# Patient Record
Sex: Male | Born: 1950 | Race: White | Hispanic: No | Marital: Married | Smoking: Former smoker
Health system: Southern US, Community
[De-identification: ages and names within clinical notes are randomized; demographics above are authoritative.]

## PROBLEM LIST (undated history)

## (undated) DIAGNOSIS — Z8719 Personal history of other diseases of the digestive system: Secondary | ICD-10-CM

## (undated) DIAGNOSIS — K219 Gastro-esophageal reflux disease without esophagitis: Secondary | ICD-10-CM

## (undated) DIAGNOSIS — E785 Hyperlipidemia, unspecified: Secondary | ICD-10-CM

## (undated) DIAGNOSIS — Z8739 Personal history of other diseases of the musculoskeletal system and connective tissue: Secondary | ICD-10-CM

## (undated) DIAGNOSIS — IMO0001 Reserved for inherently not codable concepts without codable children: Secondary | ICD-10-CM

## (undated) DIAGNOSIS — G4730 Sleep apnea, unspecified: Secondary | ICD-10-CM

## (undated) DIAGNOSIS — M75100 Unspecified rotator cuff tear or rupture of unspecified shoulder, not specified as traumatic: Secondary | ICD-10-CM

## (undated) DIAGNOSIS — M2550 Pain in unspecified joint: Secondary | ICD-10-CM

## (undated) HISTORY — PX: HERNIA REPAIR: SHX51

## (undated) HISTORY — PX: TONSILLECTOMY: SUR1361

## (undated) HISTORY — DX: Hyperlipidemia, unspecified: E78.5

## (undated) HISTORY — PX: ESOPHAGOGASTRODUODENOSCOPY: SHX1529

## (undated) HISTORY — PX: COLONOSCOPY: SHX174

---

## 2010-03-17 HISTORY — PX: CATARACT EXTRACTION: SUR2

## 2011-04-17 ENCOUNTER — Ambulatory Visit (INDEPENDENT_AMBULATORY_CARE_PROVIDER_SITE_OTHER): Payer: Commercial Managed Care - PPO | Admitting: General Surgery

## 2011-04-17 ENCOUNTER — Encounter (INDEPENDENT_AMBULATORY_CARE_PROVIDER_SITE_OTHER): Payer: Self-pay | Admitting: General Surgery

## 2011-04-17 VITALS — BP 126/84 | HR 64 | Temp 98.6°F | Resp 18 | Ht 68.0 in | Wt 254.2 lb

## 2011-04-17 DIAGNOSIS — K429 Umbilical hernia without obstruction or gangrene: Secondary | ICD-10-CM

## 2011-04-17 NOTE — Progress Notes (Signed)
Patient ID: Harry Hall, male   DOB: 1951/04/18, 60 y.o.   MRN: [id number]  Chief Complaint  Patient presents with  . New Evaluation    eval of umbilical hernia     HPI Harry Hall is a 60 y.o. male.   HPI This patient is referred by Dr. Kathrynn Running for evaluation of an umbilical hernia. He states that he has had this for several years now but his wife is now pressuring him to have it fixed. He states it has increased in size over this time and a few months ago it bulged out" and twisted". He had a difficult time reducing the hernia at that time but other than that he states that the hernia is reducible. He denies any obstructive symptoms or changes in his bowels. He had a normal colonoscopy approximately one to 2 years ago. Past Medical History  Diagnosis Date  . Hyperlipidemia     Past Surgical History  Procedure Date  . Tonsillectomy   . Cataract extraction 03/17/2010    History reviewed. No pertinent family history.  Social History History  Substance Use Topics  . Smoking status: Former Games developer  . Smokeless tobacco: Never Used  . Alcohol Use: 8.4 oz/week    14 Shots of liquor per week    No Known Allergies  Current Outpatient Prescriptions  Medication Sig Dispense Refill  . aspirin 81 MG tablet Take 81 mg by mouth daily.        Marland Kitchen atorvastatin (LIPITOR) 80 MG tablet Take 80 mg by mouth daily.        . fish oil-omega-3 fatty acids 1000 MG capsule Take 2 g by mouth daily.          Review of Systems Review of Systems  All other systems reviewed and are negative.    Blood pressure 126/84, pulse 64, temperature 98.6 F (37 C), temperature source Temporal, resp. rate 18, height 5\' 8"  (1.727 m), weight 254 lb 4 oz (115.327 kg).  Physical Exam Physical Exam  Vitals reviewed. Constitutional: He is oriented to person, place, and time. He appears well-developed and well-nourished. No distress.  HENT:  Head: Normocephalic and atraumatic.  Mouth/Throat: No  oropharyngeal exudate.  Eyes: Conjunctivae and EOM are normal. Pupils are equal, round, and reactive to light. Right eye exhibits no discharge. Left eye exhibits no discharge. No scleral icterus.  Neck: Normal range of motion. No tracheal deviation present.  Cardiovascular: Normal rate, regular rhythm and normal heart sounds.   Pulmonary/Chest: Effort normal. No stridor. No respiratory distress. He has no wheezes.  Abdominal: Soft. Bowel sounds are normal. He exhibits no distension and no mass. There is no tenderness. There is no rebound and no guarding.       moderate sized umbilical hernia which is not completely reducible on exam today. No skin changes or signs of strangulation   Musculoskeletal: Normal range of motion. He exhibits no edema and no tenderness.  Neurological: He is alert and oriented to person, place, and time.  Skin: Skin is warm and dry. No rash noted. He is not diaphoretic. No erythema. No pallor.  Psychiatric: He has a normal mood and affect. His behavior is normal. Judgment and thought content normal.    Data Reviewed   Assessment    Umbilical hernia He has a moderate-sized hernia and has had an episode of what he describes as incarceration however it is a partially reducible on exam today. He is relatively asymptomatic however it is increasing in size  and he would like it repaired. We discussed both  options of open and laparoscopic repairs. We discussed the risks and benefits of each repair. I think that given his size and the size of this hernia I think that a laparoscopic repair would probably be best for him. He also has a large diastasis and I explained that this would not be repaired with the surgery. I explained the risks of infection, bleeding, pain, scarring, recurrence, seroma, and persistent bulge, chronic pain. He expressed understanding and desires to proceed with laparoscopic umbilical hernia repair with mesh.    Plan    We will plan for laparoscopic  repair as soon as available. I would recommend at least overnight observation for pain control as well.       Lodema Pilot DAVID 04/17/2011, 3:38 PM

## 2011-09-19 DIAGNOSIS — E785 Hyperlipidemia, unspecified: Secondary | ICD-10-CM | POA: Insufficient documentation

## 2012-09-21 DIAGNOSIS — Q792 Exomphalos: Secondary | ICD-10-CM | POA: Insufficient documentation

## 2013-03-24 ENCOUNTER — Telehealth (INDEPENDENT_AMBULATORY_CARE_PROVIDER_SITE_OTHER): Payer: Self-pay

## 2013-03-24 NOTE — Telephone Encounter (Signed)
Called and left messae for patient to call our office regarding his appointment tomorrow.  Please see if patient can come in at 10:45am.

## 2013-03-25 ENCOUNTER — Ambulatory Visit (INDEPENDENT_AMBULATORY_CARE_PROVIDER_SITE_OTHER): Payer: BC Managed Care – PPO | Admitting: General Surgery

## 2013-03-25 ENCOUNTER — Encounter (INDEPENDENT_AMBULATORY_CARE_PROVIDER_SITE_OTHER): Payer: Self-pay | Admitting: General Surgery

## 2013-03-25 VITALS — BP 146/76 | HR 76 | Temp 97.4°F | Resp 16 | Ht 67.0 in | Wt 251.4 lb

## 2013-03-25 DIAGNOSIS — K429 Umbilical hernia without obstruction or gangrene: Secondary | ICD-10-CM

## 2013-03-25 NOTE — Progress Notes (Signed)
Patient ID: Harry Hall, male   DOB: 19-Dec-1950, 62 y.o.   MRN: [id number]  Chief Complaint  Patient presents with  . Umbilical Hernia    LTF    HPI Harry Hall is a 61 y.o. male.  The patient is a 62 year old male who earlier for secondary to an umbilical hernia. Patient states she's had this there for several years. He states that maybe a 1 point twisted. The patient states that he feels that the hernia has gotten bigger over time. He states he has minimal pain.  HPI  Past Medical History  Diagnosis Date  . Hyperlipidemia     Past Surgical History  Procedure Laterality Date  . Tonsillectomy    . Cataract extraction  03/17/2010    Family History  Problem Relation Age of Onset  . Cancer Father     pancreatic    Social History History  Substance Use Topics  . Smoking status: Former Games developer  . Smokeless tobacco: Never Used  . Alcohol Use: 8.4 oz/week    14 Shots of liquor per week    No Known Allergies  Current Outpatient Prescriptions  Medication Sig Dispense Refill  . aspirin 81 MG tablet Take 81 mg by mouth daily.        Marland Kitchen atorvastatin (LIPITOR) 80 MG tablet Take 80 mg by mouth daily.        . fish oil-omega-3 fatty acids 1000 MG capsule Take 2 g by mouth daily.         No current facility-administered medications for this visit.    Review of Systems Review of Systems  Constitutional: Negative.   HENT: Negative.   Respiratory: Negative.   Cardiovascular: Negative.   Gastrointestinal: Negative.   Neurological: Negative.   All other systems reviewed and are negative.    Blood pressure 146/76, pulse 76, temperature 97.4 F (36.3 C), temperature source Temporal, resp. rate 16, height 5\' 7"  (1.702 m), weight 251 lb 6.4 oz (114.034 kg).  Physical Exam Physical Exam  Constitutional: He is oriented to person, place, and time. He appears well-developed and well-nourished.  HENT:  Head: Normocephalic and atraumatic.  Eyes: Conjunctivae and EOM are  normal. Pupils are equal, round, and reactive to light.  Neck: Normal range of motion. Neck supple.  Cardiovascular: Normal rate, regular rhythm and normal heart sounds.   Pulmonary/Chest: Effort normal and breath sounds normal.  Abdominal: Soft. Bowel sounds are normal. A hernia is present. Hernia confirmed positive in the ventral area.    Musculoskeletal: Normal range of motion.  Neurological: He is alert and oriented to person, place, and time.  Skin: Skin is warm and dry.    Data Reviewed none  Assessment    62 year old male with an incarcerated umbilical hernia, and rectus diastases     Plan    1. We'll proceed to the operating room for a laparoscopic umbilical hernia repair with mesh. 2.All risks and benefits were discussed with the patient, to generally include infection, bleeding, damage to surrounding structures, acute and chronic nerve pain, and recurrence. Alternatives were offered and described.  All questions were answered and the patient voiced understanding of the procedure and wishes to proceed at this point.         Marigene Ehlers., Shaquoia Miers 03/25/2013, 11:02 AM

## 2013-03-29 ENCOUNTER — Telehealth (INDEPENDENT_AMBULATORY_CARE_PROVIDER_SITE_OTHER): Payer: Self-pay | Admitting: General Surgery

## 2013-03-29 NOTE — Telephone Encounter (Signed)
Patient met with surgery scheduling given financial responsibilities, will call back to schedule, face sheet placed in pending °

## 2013-10-07 ENCOUNTER — Emergency Department (HOSPITAL_COMMUNITY): Payer: Worker's Compensation

## 2013-10-07 ENCOUNTER — Observation Stay (HOSPITAL_COMMUNITY)
Admission: EM | Admit: 2013-10-07 | Discharge: 2013-10-12 | Disposition: A | Discharge status: Skilled Nursing Facility | Payer: Worker's Compensation | Attending: Orthopedic Surgery | Admitting: Orthopedic Surgery

## 2013-10-07 ENCOUNTER — Encounter (HOSPITAL_COMMUNITY): Payer: Self-pay | Admitting: Emergency Medicine

## 2013-10-07 DIAGNOSIS — Z7982 Long term (current) use of aspirin: Secondary | ICD-10-CM | POA: Insufficient documentation

## 2013-10-07 DIAGNOSIS — E785 Hyperlipidemia, unspecified: Secondary | ICD-10-CM | POA: Insufficient documentation

## 2013-10-07 DIAGNOSIS — M75100 Unspecified rotator cuff tear or rupture of unspecified shoulder, not specified as traumatic: Secondary | ICD-10-CM

## 2013-10-07 DIAGNOSIS — S92002A Unspecified fracture of left calcaneus, initial encounter for closed fracture: Secondary | ICD-10-CM | POA: Diagnosis present

## 2013-10-07 DIAGNOSIS — S92009A Unspecified fracture of unspecified calcaneus, initial encounter for closed fracture: Principal | ICD-10-CM | POA: Insufficient documentation

## 2013-10-07 DIAGNOSIS — Z87891 Personal history of nicotine dependence: Secondary | ICD-10-CM | POA: Insufficient documentation

## 2013-10-07 DIAGNOSIS — S43004A Unspecified dislocation of right shoulder joint, initial encounter: Secondary | ICD-10-CM

## 2013-10-07 DIAGNOSIS — W11XXXA Fall on and from ladder, initial encounter: Secondary | ICD-10-CM | POA: Insufficient documentation

## 2013-10-07 DIAGNOSIS — S43016A Anterior dislocation of unspecified humerus, initial encounter: Secondary | ICD-10-CM | POA: Insufficient documentation

## 2013-10-07 DIAGNOSIS — Y990 Civilian activity done for income or pay: Secondary | ICD-10-CM | POA: Insufficient documentation

## 2013-10-07 HISTORY — DX: Unspecified rotator cuff tear or rupture of unspecified shoulder, not specified as traumatic: M75.100

## 2013-10-07 LAB — COMPREHENSIVE METABOLIC PANEL
ALK PHOS: 57 U/L (ref 39–117)
ALT: 26 U/L (ref 0–53)
AST: 18 U/L (ref 0–37)
Albumin: 4.1 g/dL (ref 3.5–5.2)
BILIRUBIN TOTAL: 0.5 mg/dL (ref 0.3–1.2)
BUN: 13 mg/dL (ref 6–23)
CHLORIDE: 104 meq/L (ref 96–112)
CO2: 25 meq/L (ref 19–32)
Calcium: 9.7 mg/dL (ref 8.4–10.5)
Creatinine, Ser: 0.84 mg/dL (ref 0.50–1.35)
GFR calc non Af Amer: >90 mL/min (ref >90)
GLUCOSE: 117 mg/dL — AB (ref 70–99)
Potassium: 3.9 mEq/L (ref 3.7–5.3)
SODIUM: 143 meq/L (ref 137–147)
Total Protein: 6.9 g/dL (ref 6.0–8.3)

## 2013-10-07 LAB — CBC WITH DIFFERENTIAL/PLATELET
Basophils Absolute: 0 10*3/uL (ref 0.0–0.1)
Basophils Relative: 0 % (ref 0–1)
EOS PCT: 1 % (ref 0–5)
Eosinophils Absolute: 0.1 10*3/uL (ref 0.0–0.7)
HCT: 44.2 % (ref 39.0–52.0)
Hemoglobin: 14.7 g/dL (ref 13.0–17.0)
LYMPHS ABS: 1.8 10*3/uL (ref 0.7–4.0)
LYMPHS PCT: 20 % (ref 12–46)
MCH: 31.3 pg (ref 26.0–34.0)
MCHC: 33.3 g/dL (ref 30.0–36.0)
MCV: 94.2 fL (ref 78.0–100.0)
Monocytes Absolute: 0.6 10*3/uL (ref 0.1–1.0)
Monocytes Relative: 6 % (ref 3–12)
NEUTROS ABS: 6.6 10*3/uL (ref 1.7–7.7)
Neutrophils Relative %: 73 % (ref 43–77)
PLATELETS: 265 10*3/uL (ref 150–400)
RBC: 4.69 MIL/uL (ref 4.22–5.81)
RDW: 13.6 % (ref 11.5–15.5)
WBC: 9 10*3/uL (ref 4.0–10.5)

## 2013-10-07 MED ORDER — HYDROMORPHONE HCL PF 1 MG/ML IJ SOLN
1.0000 mg | Freq: Once | INTRAMUSCULAR | Status: AC
Start: 2013-10-07 — End: 2013-10-07
  Administered 2013-10-07: 1 mg via INTRAVENOUS
  Filled 2013-10-07: qty 1

## 2013-10-07 MED ORDER — HYDROMORPHONE HCL PF 1 MG/ML IJ SOLN
INTRAMUSCULAR | Status: AC
  Filled 2013-10-07: qty 1

## 2013-10-07 MED ORDER — HYDROMORPHONE HCL PF 1 MG/ML IJ SOLN
1.0000 mg | Freq: Once | INTRAMUSCULAR | Status: AC
Start: 2013-10-07 — End: 2013-10-07
  Administered 2013-10-07: 1 mg via INTRAVENOUS

## 2013-10-07 MED ORDER — IOHEXOL 300 MG/ML  SOLN
100.0000 mL | Freq: Once | INTRAMUSCULAR | Status: AC | PRN
  Administered 2013-10-07: 100 mL via INTRAVENOUS

## 2013-10-07 NOTE — ED Notes (Signed)
Right shoulder relocated by Dr Darl Householder without any problems.  Pt tolerated well.

## 2013-10-07 NOTE — ED Notes (Signed)
Returned from CT and x-ray.  Pt remains alert and oriented x's 3.  Continues to c/o pain.

## 2013-10-07 NOTE — ED Notes (Signed)
Family at bedside. 

## 2013-10-07 NOTE — ED Notes (Signed)
Pt to x-ray and CT at this time.

## 2013-10-07 NOTE — ED Notes (Signed)
Pt to ED via [facility] after reported falling approx 15-20 ft off a ladder.  Pt landed in a bush and denies LOC.  Pt c/o pain in right shoulder and left ankle.  EMS gave pt Fentanyl 159mcg IV PTA.

## 2013-10-07 NOTE — ED Notes (Signed)
Pt remains in radiology. Introduced self to family in room.

## 2013-10-07 NOTE — ED Notes (Signed)
Pt st's no change in pain after pain med.  Pt remains alert and oriented x's 3.

## 2013-10-07 NOTE — ED Provider Notes (Signed)
CSN: [id number]     Arrival date & time 10/07/13  1933 History   First MD Initiated Contact with Patient 10/07/13 1947     Chief Complaint  Patient presents with  . Fall  . Shoulder Injury  . Ankle Pain     (Consider location/radiation/quality/duration/timing/severity/associated sxs/prior Treatment) The history is provided by the patient.  Harry Hall is a 63 y.o. male hx of HL here with fall. Patient was on a 20 foot ladder and the ladder broke and he fell off and landed in a bush. Had possible head injury but no LOC. Complained of back and R shoulder pain and left ankle pain afterwards. Given fentanyl 150 mcg by EMS.    Past Medical History  Diagnosis Date  . Hyperlipidemia    Past Surgical History  Procedure Laterality Date  . Tonsillectomy    . Cataract extraction  03/17/2010   Family History  Problem Relation Age of Onset  . Cancer Father     pancreatic   History  Substance Use Topics  . Smoking status: Former Research scientist (life sciences)  . Smokeless tobacco: Never Used  . Alcohol Use: 8.4 oz/week    14 Shots of liquor per week    Review of Systems  Musculoskeletal: Positive for back pain.       L ankle pain, R shoulder pain   All other systems reviewed and are negative.     Allergies  Review of patient's allergies indicates no known allergies.  Home Medications   Prior to Admission medications   Medication Sig Start Date End Date Taking? Authorizing Provider  aspirin 81 MG tablet Take 81 mg by mouth daily.     Yes Historical Provider, MD  atorvastatin (LIPITOR) 80 MG tablet Take 80 mg by mouth daily.     Yes Historical Provider, MD  cetirizine (ZYRTEC) 10 MG tablet Take 10 mg by mouth daily.   Yes Historical Provider, MD  fish oil-omega-3 fatty acids 1000 MG capsule Take 1 g by mouth daily.    Yes Historical Provider, MD  ibuprofen (ADVIL,MOTRIN) 200 MG tablet Take 600 mg by mouth every 6 (six) hours as needed for headache.   Yes Historical Provider, MD   BP 126/65   Pulse 66  Temp(Src) 98.5 F (36.9 C) (Oral)  Resp 25  Ht 5\' 8"  (1.727 m)  Wt 245 lb (111.131 kg)  BMI 37.26 kg/m2  SpO2 94% Physical Exam  Nursing note and vitals reviewed. Constitutional: He is oriented to person, place, and time.  Uncomfortable  HENT:  Head: Normocephalic.  Mouth/Throat: Oropharynx is clear and moist.  Eyes: Conjunctivae are normal. Pupils are equal, round, and reactive to light.  Neck:   c collar in place   Cardiovascular: Normal rate, regular rhythm and normal heart sounds.   Pulmonary/Chest: Effort normal and breath sounds normal. No respiratory distress. He has no wheezes. He has no rales.  Abdominal: Soft. Bowel sounds are normal. He exhibits no distension. There is no tenderness. There is no rebound.  Musculoskeletal:  R shoulder anteriorly dislocated. L ankle swollen and tender. Nl ROM bilateral hips.   Neurological: He is alert and oriented to person, place, and time. No cranial nerve deficit. Coordination normal.  Skin: Skin is warm and dry.  Psychiatric: He has a normal mood and affect. His behavior is normal. Judgment and thought content normal.    ED Course  Reduction of dislocation Date/Time: 10/07/2013 11:32 PM Performed by: Wandra Arthurs Authorized by: Wandra Arthurs Consent: Verbal  consent obtained. Risks and benefits: risks, benefits and alternatives were discussed Consent given by: patient Patient understanding: patient states understanding of the procedure being performed Patient consent: the patient's understanding of the procedure matches consent given Procedure consent: procedure consent matches procedure scheduled Relevant documents: relevant documents present and verified Required items: required blood products, implants, devices, and special equipment available Patient identity confirmed: arm band and verbally with patient Time out: Immediately prior to procedure a "time out" was called to verify the correct patient, procedure,  equipment, support staff and site/side marked as required. Preparation: Patient was prepped and draped in the usual sterile fashion. Local anesthesia used: yes Anesthesia: nerve block Local anesthetic: lidocaine 2% without epinephrine Anesthetic total: 10 ml Patient sedated: no Patient tolerance: Patient tolerated the procedure well with no immediate complications.   (including critical care time)    Labs Review Labs Reviewed  COMPREHENSIVE METABOLIC PANEL - Abnormal; Notable for the following:    Glucose, Bld 117 (*)    All other components within normal limits  CBC WITH DIFFERENTIAL    Imaging Review Dg Chest 2 View  10/07/2013   CLINICAL DATA:  25 foot fall  EXAM: CHEST  2 VIEW  COMPARISON:  Concurrently obtained radiographs of the bilateral shoulders  FINDINGS: Incompletely imaged right inferior shoulder dislocation. Inspiratory volumes are low and there may be mild bibasilar atelectasis. No pneumothorax or large pleural effusion. No displaced rib fracture. The vertebral body heights are maintained on the lateral view. Mild multilevel degenerative spurring.  IMPRESSION: 1. Low inspiratory volumes and bibasilar subsegmental atelectasis. 2. Otherwise, no acute cardiopulmonary process. 3. Incompletely imaged right anteroinferior shoulder dislocation.   Electronically Signed   By: Jacqulynn Cadet M.D.   On: 10/07/2013 21:48   Dg Shoulder Right  10/07/2013   CLINICAL DATA:  [place] 25 foot from a ladder that broke, RIGHT shoulder pain  EXAM: RIGHT SHOULDER - 2+ VIEW  COMPARISON:  None  FINDINGS: Osseous mineralization normal for technique.  AC joint alignment normal.  Question os acromiale.  Anterior glenohumeral dislocation.  No definite fracture.  Visualized RIGHT ribs intact.  IMPRESSION: Anterior RIGHT glenohumeral dislocation.   Electronically Signed   By: Lavonia Dana M.D.   On: 10/07/2013 21:49   Dg Ankle Complete Left  10/07/2013   CLINICAL DATA:  25 foot fall  EXAM: LEFT ANKLE  COMPLETE - 3+ VIEW  COMPARISON:  None.  FINDINGS: Comminuted fracture through the mid aspect of the calcaneus. There is associated soft tissue swelling and infiltration of Kager's fat pad. Fracture line extends into the articular surface of the subtalar space. Focal lucency in the medial aspect of the talar dome concerning for acute osteochondral defect. The distal fibula and tibia appear intact.  IMPRESSION: 1. Comminuted and impacted fracture through the mid calcaneus. 2. Suspect acute osteochondral defect of the medial aspect of the talar dome. 3. Extensive soft tissue swelling about the ankle.   Electronically Signed   By: Jacqulynn Cadet M.D.   On: 10/07/2013 21:50   Ct Head Wo Contrast  10/07/2013   CLINICAL DATA:  Fell 15-20 feet off a ladder landing in bushes, RIGHT shoulder pain/injury  EXAM: CT HEAD WITHOUT CONTRAST  CT CERVICAL SPINE WITHOUT CONTRAST  TECHNIQUE: Multidetector CT imaging of the head and cervical spine was performed following the standard protocol without intravenous contrast. Multiplanar CT image reconstructions of the cervical spine were also generated.  COMPARISON:  None  FINDINGS: CT HEAD FINDINGS  Minimal age-related atrophy.  Normal ventricular  morphology.  No midline shift or mass effect.  Otherwise normal appearance of brain parenchyma.  No intracranial hemorrhage, mass lesion or evidence acute infarction.  No extra-axial fluid collections.  Bones is sinuses unremarkable.  CT CERVICAL SPINE FINDINGS  Prevertebral soft tissues normal thickness.  Disc space narrowing and endplate spur formation C6-C7, C4-C5.  Vertebral body heights maintained without fracture or subluxation.  Scattered facet degenerative changes greater in upper to mid cervical spine.  Visualized skullbase intact.  Atherosclerotic calcification in the LEFT carotid system.  Lung apices clear.  IMPRESSION: No acute intracranial abnormalities.  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   No acute cervical spine abnormalities.   Electronically Signed   By: Lavonia Dana M.D.   On: 10/07/2013 22:23   Ct Cervical Spine Wo Contrast  10/07/2013   CLINICAL DATA:  [place] 15-20 feet off a ladder landing in bushes, RIGHT shoulder pain/injury  EXAM: CT HEAD WITHOUT CONTRAST  CT CERVICAL SPINE WITHOUT CONTRAST  TECHNIQUE: Multidetector CT imaging of the head and cervical spine was performed following the standard protocol without intravenous contrast. Multiplanar CT image reconstructions of the cervical spine were also generated.  COMPARISON:  None  FINDINGS: CT HEAD FINDINGS  Minimal age-related atrophy.  Normal ventricular morphology.  No midline shift or mass effect.  Otherwise normal appearance of brain parenchyma.  No intracranial hemorrhage, mass lesion or evidence acute infarction.  No extra-axial fluid collections.  Bones is sinuses unremarkable.  CT CERVICAL SPINE FINDINGS  Prevertebral soft tissues normal thickness.  Disc space narrowing and endplate spur formation C6-C7, C4-C5.  Vertebral body heights maintained without fracture or subluxation.  Scattered facet degenerative changes greater in upper to mid cervical spine.  Visualized skullbase intact.  Atherosclerotic calcification in the LEFT carotid system.  Lung apices clear.  IMPRESSION: No acute intracranial abnormalities.  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  No acute cervical spine abnormalities.   Electronically Signed   By: Lavonia Dana M.D.   On: 10/07/2013 22:23     EKG Interpretation None      MDM   Final diagnoses:  None   Harry Hall is a 63 y.o. male here with fall. Will do trauma workup and get xrays.   11:32 PM Xray showed R shoulder dislocation and L ankle fracture. I reduced R shoulder dislocation. I called Dr. Marlou Sa, who recommend CT for L ankle and back xrays. Will get post reduction xray, back xrays and CT ab/pel. CT head/neck unremarkable. Signed out to Dr. Sharol Given to f/u  xrays and CT and ortho consult.     Wandra Arthurs, MD 10/07/13 [phone]

## 2013-10-08 ENCOUNTER — Other Ambulatory Visit (HOSPITAL_COMMUNITY): Payer: Self-pay | Admitting: Orthopedic Surgery

## 2013-10-08 ENCOUNTER — Observation Stay (HOSPITAL_COMMUNITY): Payer: Worker's Compensation

## 2013-10-08 ENCOUNTER — Encounter (HOSPITAL_COMMUNITY): Payer: Self-pay | Admitting: *Deleted

## 2013-10-08 DIAGNOSIS — S92002A Unspecified fracture of left calcaneus, initial encounter for closed fracture: Secondary | ICD-10-CM | POA: Diagnosis present

## 2013-10-08 LAB — SURGICAL PCR SCREEN
MRSA, PCR: NEGATIVE
Staphylococcus aureus: NEGATIVE

## 2013-10-08 MED ORDER — ATORVASTATIN CALCIUM 80 MG PO TABS
80.0000 mg | ORAL_TABLET | Freq: Every day | ORAL | Status: DC
  Administered 2013-10-08 – 2013-10-12 (×4): 80 mg via ORAL
  Filled 2013-10-08 (×5): qty 1

## 2013-10-08 MED ORDER — CHLORHEXIDINE GLUCONATE 4 % EX LIQD
60.0000 mL | Freq: Once | CUTANEOUS | Status: AC
  Administered 2013-10-08: 4 via TOPICAL
  Filled 2013-10-08: qty 60

## 2013-10-08 MED ORDER — ASPIRIN EC 81 MG PO TBEC
81.0000 mg | DELAYED_RELEASE_TABLET | Freq: Every day | ORAL | Status: DC
  Administered 2013-10-08: 81 mg via ORAL
  Filled 2013-10-08 (×2): qty 1

## 2013-10-08 MED ORDER — CHLORHEXIDINE GLUCONATE 4 % EX LIQD
60.0000 mL | Freq: Once | CUTANEOUS | Status: AC
  Administered 2013-10-09: 4 via TOPICAL
  Filled 2013-10-08: qty 60

## 2013-10-08 MED ORDER — ENOXAPARIN SODIUM 30 MG/0.3ML ~~LOC~~ SOLN
30.0000 mg | Freq: Two times a day (BID) | SUBCUTANEOUS | Status: DC
  Administered 2013-10-09: 30 mg via SUBCUTANEOUS
  Filled 2013-10-08 (×2): qty 0.3

## 2013-10-08 MED ORDER — CEFAZOLIN SODIUM-DEXTROSE 2-3 GM-% IV SOLR
2.0000 g | INTRAVENOUS | Status: AC
  Administered 2013-10-09: 3 g via INTRAVENOUS
  Filled 2013-10-08: qty 50

## 2013-10-08 MED ORDER — HEPARIN SODIUM (PORCINE) 5000 UNIT/ML IJ SOLN
5000.0000 [IU] | Freq: Three times a day (TID) | INTRAMUSCULAR | Status: DC
  Administered 2013-10-08 (×3): 5000 [IU] via SUBCUTANEOUS
  Filled 2013-10-08 (×6): qty 1

## 2013-10-08 MED ORDER — OXYCODONE-ACETAMINOPHEN 5-325 MG PO TABS
2.0000 | ORAL_TABLET | ORAL | Status: DC | PRN
  Administered 2013-10-08 (×4): 2 via ORAL
  Filled 2013-10-08 (×4): qty 2

## 2013-10-08 MED ORDER — LORATADINE 10 MG PO TABS
10.0000 mg | ORAL_TABLET | Freq: Every day | ORAL | Status: DC
  Administered 2013-10-08 – 2013-10-12 (×4): 10 mg via ORAL
  Filled 2013-10-08 (×5): qty 1

## 2013-10-08 MED ORDER — HYDROMORPHONE HCL PF 1 MG/ML IJ SOLN
1.0000 mg | Freq: Once | INTRAMUSCULAR | Status: AC
  Administered 2013-10-08: 1 mg via INTRAVENOUS
  Filled 2013-10-08: qty 1

## 2013-10-08 NOTE — Evaluation (Signed)
Physical Therapy Evaluation Patient Details Name: Harry Hall MRN: [id number] DOB: 02-Mar-1951 Today's Date: 10/08/2013   History of Present Illness  Pt. was admitted 10/07/13 following a 20 foot fall from a ladder after ladder broke.  Pt. sustained  a R shoulder dislocation, now in sling and a Left calcaneus fx.  Dr. Sharol Given to determine time of surgical repair of heel fx. Shoulder dislocation reduced in the ED.  P.T. ordered for NWB L LE  Clinical Impression  Pt. Presents to PT with deficits in functional mobility and gait due to his injuries.  He reports that his left foot/ankle feels different than it had earlier.  The RN is aware and is contacting ortho tech to check his splint.  Toes feel warm, he can wiggle toes and has good sensation.  In terms of his mobility, he is currently limited to transfers. Once results of MRI are known, if he is allowed to WB through R UE, we may need to try platform RW.  Pt's left heel to be evaluated by Dr. Sharol Given to determine timing for surgical fixation.  Will follow for transfers in the meantime.    Follow Up Recommendations Other (comment) (TBD postoperatively, may need HHPT)    Equipment Recommendations  Other (comment) (TBD post operatively/ post MRI)    Recommendations for Other Services       Precautions / Restrictions Precautions Precautions: Fall Required Braces or Orthoses: Sling Restrictions Weight Bearing Restrictions: Yes LLE Weight Bearing: Non weight bearing Other Position/Activity Restrictions: Pt.'s R UE in sling and presumed NWB until results of MRI known.        Mobility  Bed Mobility Overal bed mobility: Modified Independent             General bed mobility comments: pt. came to EOB independently with slight increased time needed due to injuries  Transfers Overall transfer level: Needs assistance Equipment used: None Transfers: Sit to/from Stand;Stand Pivot Transfers   Stand pivot transfers: +2 physical  assistance;Min assist       General transfer comment: Pt. needed min assist to rise to stand and 2 min assist for pivot transfer to recliner for balance and stability.    Ambulation/Gait Ambulation/Gait assistance:  (not able)              Stairs            Wheelchair Mobility    Modified Rankin (Stroke Patients Only)       Balance Overall balance assessment: Needs assistance Sitting-balance support: Feet supported Sitting balance-Leahy Scale: Good     Standing balance support: Single extremity supported;During functional activity Standing balance-Leahy Scale: Fair Standing balance comment: support needed to maintain standing balance briefly during transfer                             Pertinent Vitals/Pain See vitals tab Painful in left foot/ankle , says he had pain med at 12:30pm    [place] expects to be discharged to:: Private residence Living Arrangements: Spouse/significant other Available Help at Discharge: Family;Available 24 hours/day Type of Home: House Home Access: Level entry     Home Layout: One level Home Equipment: None      Prior Function Level of Independence: Independent         Comments: Pt. reports he works and is fully independent     Hand Dominance   Dominant Hand: Right    Extremity/Trunk Assessment   Upper  Extremity Assessment: Defer to OT evaluation           Lower Extremity Assessment: LLE deficits/detail   LLE Deficits / Details: Pt. has splint intact Left lower leg; toes are warm and dry, he has sensation to light touch and he can wiggle toes.  He reports his left foot/heel feels different than it did earlier .  He has made his RN aware of this and RN is about to phone ortho tech to check his splint.    Cervical / Trunk Assessment: Normal  Communication   Communication: No difficulties  Cognition Arousal/Alertness: Awake/alert Behavior During Therapy: WFL for tasks  assessed/performed Overall Cognitive Status: Within Functional Limits for tasks assessed                      General Comments      Exercises        Assessment/Plan    PT Assessment Patient needs continued PT services  PT Diagnosis Difficulty walking;Acute pain;Abnormality of gait   PT Problem List Decreased activity tolerance;Decreased balance;Decreased mobility;Decreased knowledge of use of DME;Decreased knowledge of precautions;Pain  PT Treatment Interventions DME instruction;Gait training;Functional mobility training;Therapeutic activities;Balance training;Patient/family education;Therapeutic exercise   PT Goals (Current goals can be found in the Care Plan section) Acute Rehab PT Goals Patient Stated Goal: home for recovery PT Goal Formulation: With patient Time For Goal Achievement: 10/15/13 Potential to Achieve Goals: Good    Frequency Min 5X/week   Barriers to discharge        Co-evaluation PT/OT/SLP Co-Evaluation/Treatment: Yes Reason for Co-Treatment: For patient/therapist safety PT goals addressed during session: Mobility/safety with mobility;Balance;Other (comment) (precautions)         End of Session Equipment Utilized During Treatment: Gait belt Activity Tolerance: Patient tolerated treatment well Patient left: in chair;with call bell/phone within reach;with family/visitor present Nurse Communication: Mobility status;Precautions;Weight bearing status (informed nursing tech of safest technique for back to bed)    Functional Assessment Tool Used: clinical observation Functional Limitation: Mobility: Walking and moving around Mobility: Walking and Moving Around Current Status ([id number]): At least 40 percent but less than 60 percent impaired, limited or restricted Mobility: Walking and Moving Around Goal Status [phone]): At least 20 percent but less than 40 percent impaired, limited or restricted Mobility: Walking and Moving Around Discharge Status  [phone]): At least 20 percent but less than 40 percent impaired, limited or restricted    Time: 3212-2482 PT Time Calculation (min): 24 min   Charges:   PT Evaluation $Initial PT Evaluation Tier I: 1 Procedure PT Treatments $Therapeutic Activity: 8-22 mins   PT G Codes:   Functional Assessment Tool Used: clinical observation Functional Limitation: Mobility: Walking and moving around    Ladona Ridgel 10/08/2013, 2:15 PM Gerlean Ren PT Acute Rehab Services [phone] [place] [phone]

## 2013-10-08 NOTE — Evaluation (Signed)
Occupational Therapy Evaluation Patient Details Name: Harry Hall MRN: [id number] DOB: 1951-02-22 Today's Date: 10/08/2013    History of Present Illness Pt. was admitted 10/07/13 following a 20 foot fall from a ladder after ladder broke.  Pt. sustained  a R shoulder dislocation, now in sling and a Left calcaneus fx.  Dr. Sharol Given to determine time of surgical repair of heel fx. Shoulder dislocation reduced in the ED.     Clinical Impression   Pt admitted with above. OT order states R UE pendulums after MRI.  Pt still awaiting MRI of right shoulder at time of eval.   Will need clarification regarding weight bearing status of R UE. Pt's left heel to be evaluated by Dr. Sharol Given to determine timing for surgical fixation.  Pt is limited to stand pivot transfers at this time.  Will continue to follow acutely in order to address below problem list.  Follow Up Recommendations   (TBD post-operatively, possibly HHOT)    Equipment Recommendations  3 in 1 bedside comode    Recommendations for Other Services       Precautions / Restrictions Precautions Precautions: Fall Required Braces or Orthoses: Sling Restrictions Weight Bearing Restrictions: Yes LLE Weight Bearing: Non weight bearing Other Position/Activity Restrictions: Pt.'s R UE in sling and presumed NWB until results of MRI known.        Mobility Bed Mobility Overal bed mobility: Modified Independent             General bed mobility comments: pt. came to EOB independently with slight increased time needed due to injuries  Transfers Overall transfer level: Needs assistance Equipment used: None Transfers: Sit to/from Stand;Stand Pivot Transfers Sit to Stand: +2 physical assistance;Min assist Stand pivot transfers: +2 physical assistance;Min assist       General transfer comment: Pt. needed min assist to rise to stand and 2 min assist for pivot transfer to recliner for balance and stability.      Balance Overall balance  assessment: Needs assistance Sitting-balance support: Feet supported Sitting balance-Leahy Scale: Good     Standing balance support: Single extremity supported;During functional activity Standing balance-Leahy Scale: Fair Standing balance comment: support needed to maintain standing balance briefly during transfer                            ADL Overall ADL's : Needs assistance/impaired Eating/Feeding: Set up;Sitting       Upper Body Bathing: Moderate assistance;Sitting   Lower Body Bathing: Minimal assistance;Sitting/lateral leans   Upper Body Dressing : Moderate assistance;Sitting Upper Body Dressing Details (indicate cue type and reason): assist with sling Lower Body Dressing: Moderate assistance;Sitting/lateral leans Lower Body Dressing Details (indicate cue type and reason):  Able to doff right sock with left hand.  Unable to don right sock. Toilet Transfer: +2 for physical assistance;Minimal assistance;Stand-pivot           Functional mobility during ADLs: +2 for physical assistance;Minimal assistance General ADL Comments: Pt requiring incr time for all tasks due to left LE pain.     Vision                     Perception     Praxis      Pertinent Vitals/Pain C/o 6/10 pain in LLE.      Hand Dominance Right   Extremity/Trunk Assessment Upper Extremity Assessment Upper Extremity Assessment: RUE deficits/detail RUE Deficits / Details: Wrist and hand AROM WFL.   RUE:  Unable to fully assess due to immobilization (sling)   Lower Extremity Assessment Lower Extremity Assessment: LLE deficits/detail LLE Deficits / Details: Pt. has splint intact Left lower leg; toes are warm and dry, he has sensation to light touch and he can wiggle toes.  He reports his left foot/heel feels different than it did earlier .  He has made his RN aware of this and RN is about to phone ortho tech to check his splint.   LLE: Unable to fully assess due to immobilization    Cervical / Trunk Assessment Cervical / Trunk Assessment: Normal   Communication Communication Communication: No difficulties   Cognition Arousal/Alertness: Awake/alert Behavior During Therapy: WFL for tasks assessed/performed Overall Cognitive Status: Within Functional Limits for tasks assessed                     General Comments       Exercises       Shoulder Instructions      Home Living Family/patient expects to be discharged to:: Private residence Living Arrangements: Spouse/significant other Available Help at Discharge: Family;Available 24 hours/day Type of Home: House Home Access: Level entry     Home Layout: One level     Bathroom Shower/Tub: Occupational psychologist: Standard     Home Equipment: None          Prior Functioning/Environment Level of Independence: Independent        Comments: Pt. reports he works and is fully independent    OT Diagnosis: Generalized weakness;Acute pain   OT Problem List: Decreased strength;Decreased activity tolerance;Impaired balance (sitting and/or standing);Decreased knowledge of use of DME or AE;Decreased knowledge of precautions;Pain   OT Treatment/Interventions: Self-care/ADL training;DME and/or AE instruction;Therapeutic activities;Patient/family education;Balance training    OT Goals(Current goals can be found in the care plan section) Acute Rehab OT Goals Patient Stated Goal: home for recovery OT Goal Formulation: With patient Time For Goal Achievement: 10/22/13 Potential to Achieve Goals: Good  OT Frequency: Min 2X/week   Barriers to D/C:            Co-evaluation PT/OT/SLP Co-Evaluation/Treatment: Yes Reason for Co-Treatment: For patient/therapist safety PT goals addressed during session: Mobility/safety with mobility;Balance;Other (comment) (precautions) OT goals addressed during session: ADL's and self-care      End of Session Equipment Utilized During Treatment: Gait  belt Nurse Communication: Mobility status  Activity Tolerance: Patient tolerated treatment well Patient left: in chair;with call bell/phone within reach;with family/visitor present;with nursing/sitter in room   Time: 1610-9604 OT Time Calculation (min): 24 min Charges:  OT General Charges $OT Visit: 1 Procedure OT Evaluation $Initial OT Evaluation Tier I: 1 Procedure OT Treatments $Self Care/Home Management : 8-22 mins G-Codes: OT G-codes **NOT FOR INPATIENT CLASS** Functional Assessment Tool Used: clinical judgment Functional Limitation: Self care Self Care Current Status ([id number]): At least 40 percent but less than 60 percent impaired, limited or restricted Self Care Goal Status ([id number]): At least 1 percent but less than 20 percent impaired, limited or restricted  Luther Bradley 10/08/2013, 2:54 PM  10/08/2013 Luther Bradley OTR/L Pager [phone] Office [phone]

## 2013-10-08 NOTE — H&P (Signed)
Harry Hall is an 63 y.o. male.   Chief Complaint: Left ankle pain right shoulder pain HPI: Harry Hall is a 63 year old patient who was working on a ladder at work tonight when he fell. The ladder broke and he fell approximately 20 feet. Denies any loss of consciousness. He does report left ankle pain and right shoulder pain. Right shoulder was dislocated in the emergency room was reduced. Left ankle had swelling and CT scan was obtained and it demonstrated calcaneus fracture. He denies any other orthopedic complaints or any abdominal complaints specifically no back pain.  Past Medical History  Diagnosis Date  . Hyperlipidemia     Past Surgical History  Procedure Laterality Date  . Tonsillectomy    . Cataract extraction  03/17/2010    Family History  Problem Relation Age of Onset  . Cancer Father     pancreatic   Social History:  reports that he has quit smoking. He has never used smokeless tobacco. He reports that he drinks about 8.4 ounces of alcohol per week. He reports that he does not use illicit drugs.  Allergies: No Known Allergies   (Not in a hospital admission)  Results for orders placed during the hospital encounter of 10/07/13 (from the past 48 hour(s))  CBC WITH DIFFERENTIAL     Status: None   Collection Time    10/07/13  7:49 PM      Result Value Ref Range   WBC 9.0  4.0 - 10.5 K/uL   RBC 4.69  4.22 - 5.81 MIL/uL   Hemoglobin 14.7  13.0 - 17.0 g/dL   HCT 44.2  39.0 - 52.0 %   MCV 94.2  78.0 - 100.0 fL   MCH 31.3  26.0 - 34.0 pg   MCHC 33.3  30.0 - 36.0 g/dL   RDW 13.6  11.5 - 15.5 %   Platelets 265  150 - 400 K/uL   Neutrophils Relative % 73  43 - 77 %   Neutro Abs 6.6  1.7 - 7.7 K/uL   Lymphocytes Relative 20  12 - 46 %   Lymphs Abs 1.8  0.7 - 4.0 K/uL   Monocytes Relative 6  3 - 12 %   Monocytes Absolute 0.6  0.1 - 1.0 K/uL   Eosinophils Relative 1  0 - 5 %   Eosinophils Absolute 0.1  0.0 - 0.7 K/uL   Basophils Relative 0  0 - 1 %   Basophils Absolute  0.0  0.0 - 0.1 K/uL  COMPREHENSIVE METABOLIC PANEL     Status: Abnormal   Collection Time    10/07/13  7:49 PM      Result Value Ref Range   Sodium 143  137 - 147 mEq/L   Potassium 3.9  3.7 - 5.3 mEq/L   Chloride 104  96 - 112 mEq/L   CO2 25  19 - 32 mEq/L   Glucose, Bld 117 (*) 70 - 99 mg/dL   BUN 13  6 - 23 mg/dL   Creatinine, Ser 0.84  0.50 - 1.35 mg/dL   Calcium 9.7  8.4 - 10.5 mg/dL   Total Protein 6.9  6.0 - 8.3 g/dL   Albumin 4.1  3.5 - 5.2 g/dL   AST 18  0 - 37 U/L   ALT 26  0 - 53 U/L   Alkaline Phosphatase 57  39 - 117 U/L   Total Bilirubin 0.5  0.3 - 1.2 mg/dL   GFR calc non Af Amer >90  >90  mL/min   GFR calc Af Amer >90  >90 mL/min   Comment: (NOTE)     The eGFR has been calculated using the CKD EPI equation.     This calculation has not been validated in all clinical situations.     eGFR's persistently <90 mL/min signify possible Chronic Kidney     Disease.   Dg Chest 2 View  10/07/2013   CLINICAL DATA:  25 foot fall  EXAM: CHEST  2 VIEW  COMPARISON:  Concurrently obtained radiographs of the bilateral shoulders  FINDINGS: Incompletely imaged right inferior shoulder dislocation. Inspiratory volumes are low and there may be mild bibasilar atelectasis. No pneumothorax or large pleural effusion. No displaced rib fracture. The vertebral body heights are maintained on the lateral view. Mild multilevel degenerative spurring.  IMPRESSION: 1. Low inspiratory volumes and bibasilar subsegmental atelectasis. 2. Otherwise, no acute cardiopulmonary process. 3. Incompletely imaged right anteroinferior shoulder dislocation.   Electronically Signed   By: Jacqulynn Cadet M.D.   On: 10/07/2013 21:48   Dg Thoracic Spine 2 View  10/08/2013   CLINICAL DATA:  Trauma, fall  EXAM: THORACIC SPINE - 2 VIEW  COMPARISON:  None  FINDINGS: Twelve pairs of ribs, less pair hypoplastic.  Mild biconvex thoracic scoliosis.  Multilevel disc space narrowing and endplate spur formation.  Vertebral body  heights maintained without fracture or subluxation.  No bone destruction.  Visualized posterior ribs appear intact.  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  No acute abnormalities.   Electronically Signed   By: Lavonia Dana M.D.   On: 10/08/2013 00:10   Dg Lumbar Spine Complete  10/08/2013   CLINICAL DATA:  Trauma, fall, shoulder injury, ankle pain  EXAM: LUMBAR SPINE - COMPLETE 4+ VIEW  COMPARISON:  None  FINDINGS: Osseous mineralization grossly normal.  Hypoplastic last rib pair.  Five non-rib-bearing lumbar vertebrae.  Scattered endplate spur formation at the lower thoracic and upper lumbar spine.  Vertebral body heights maintained without fracture or subluxation.  No bone destruction or spondylolysis.  SI joints symmetric.  IMPRESSION: Degenerative disc disease changes lower thoracic and upper lumbar spine.  No acute abnormalities.   Electronically Signed   By: Lavonia Dana M.D.   On: 10/08/2013 00:09   Dg Pelvis 1-2 Views  10/08/2013   CLINICAL DATA:  Trauma, fall, shoulder injury, ankle injury  EXAM: PELVIS - 1-2 VIEW  COMPARISON:  None  FINDINGS: Hip and SI joints symmetric and preserved.  Osseous mineralization grossly normal.  No acute fracture, dislocation or bone destruction.  IMPRESSION: No acute osseous abnormalities.   Electronically Signed   By: Lavonia Dana M.D.   On: 10/08/2013 00:08   Dg Shoulder Right  10/08/2013   CLINICAL DATA:  Status post reduction of right humeral head dislocation.  EXAM: RIGHT SHOULDER - 2+ VIEW  COMPARISON:  Right shoulder radiographs performed earlier today at 9:26 p.m.  FINDINGS: There has been successful reduction of the patient's right humeral head. A Hill-Sachs lesion is noted. There also appears to be a displaced osseous Bankart lesion. No additional fractures are seen. The right acromioclavicular joint is unremarkable in appearance. The visualized portions of the right lung are clear. No significant soft tissue  abnormalities are characterized on radiograph.  IMPRESSION: Successful reduction of right humeral head. Hill-Sachs lesion noted. There also appears to be a displaced osseous Bankart lesion.   Electronically Signed   By: Garald Balding M.D.   On: 10/08/2013 00:11   Dg Shoulder Right  10/07/2013   CLINICAL DATA:  [place] 25 foot from a ladder that broke, RIGHT shoulder pain  EXAM: RIGHT SHOULDER - 2+ VIEW  COMPARISON:  None  FINDINGS: Osseous mineralization normal for technique.  AC joint alignment normal.  Question os acromiale.  Anterior glenohumeral dislocation.  No definite fracture.  Visualized RIGHT ribs intact.  IMPRESSION: Anterior RIGHT glenohumeral dislocation.   Electronically Signed   By: Lavonia Dana M.D.   On: 10/07/2013 21:49   Dg Ankle Complete Left  10/07/2013   CLINICAL DATA:  25 foot fall  EXAM: LEFT ANKLE COMPLETE - 3+ VIEW  COMPARISON:  None.  FINDINGS: Comminuted fracture through the mid aspect of the calcaneus. There is associated soft tissue swelling and infiltration of Kager's fat pad. Fracture line extends into the articular surface of the subtalar space. Focal lucency in the medial aspect of the talar dome concerning for acute osteochondral defect. The distal fibula and tibia appear intact.  IMPRESSION: 1. Comminuted and impacted fracture through the mid calcaneus. 2. Suspect acute osteochondral defect of the medial aspect of the talar dome. 3. Extensive soft tissue swelling about the ankle.   Electronically Signed   By: Jacqulynn Cadet M.D.   On: 10/07/2013 21:50   Ct Head Wo Contrast  10/07/2013   CLINICAL DATA:  Fell 15-20 feet off a ladder landing in bushes, RIGHT shoulder pain/injury  EXAM: CT HEAD WITHOUT CONTRAST  CT CERVICAL SPINE WITHOUT CONTRAST  TECHNIQUE: Multidetector CT imaging of the head and cervical spine was performed following the standard protocol without intravenous contrast. Multiplanar CT image reconstructions of the cervical spine were also generated.   COMPARISON:  None  FINDINGS: CT HEAD FINDINGS  Minimal age-related atrophy.  Normal ventricular morphology.  No midline shift or mass effect.  Otherwise normal appearance of brain parenchyma.  No intracranial hemorrhage, mass lesion or evidence acute infarction.  No extra-axial fluid collections.  Bones is sinuses unremarkable.  CT CERVICAL SPINE FINDINGS  Prevertebral soft tissues normal thickness.  Disc space narrowing and endplate spur formation C6-C7, C4-C5.  Vertebral body heights maintained without fracture or subluxation.  Scattered facet degenerative changes greater in upper to mid cervical spine.  Visualized skullbase intact.  Atherosclerotic calcification in the LEFT carotid system.  Lung apices clear.  IMPRESSION: No acute intracranial abnormalities.  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  No acute cervical spine abnormalities.   Electronically Signed   By: Lavonia Dana M.D.   On: 10/07/2013 22:23   Ct Chest W Contrast  10/08/2013   CLINICAL DATA:  [place] 25 feet from ladder that broke, shoulder injury  EXAM: CT CHEST, ABDOMEN, AND PELVIS WITH CONTRAST  TECHNIQUE: Multidetector CT imaging of the chest, abdomen and pelvis was performed following the standard protocol during bolus administration of intravenous contrast. Sagittal and coronal MPR images reconstructed from axial data set.  CONTRAST:  134m OMNIPAQUE IOHEXOL 300 MG/ML SOLN IV. No oral contrast administered.  COMPARISON:  None  FINDINGS: CT CHEST FINDINGS  Thoracic vascular structures patent on nondedicated exam.  No thoracic adenopathy or mediastinal hematoma.  Small RIGHT shoulder joint effusion with single tiny bone fragment intra-articular.  Dependent atelectasis in both lower lobes.  No acute infiltrate, pleural effusions, or pneumothorax.  No additional fractures identified.  CT ABDOMEN AND PELVIS FINDINGS  Liver, spleen, pancreas, kidneys, and adrenal glands normal appearance.  Normal appendix.  Umbilical  hernia containing fat, fascial defect 2.0 x 1.4 cm.  Distal colonic diverticulosis without diverticulitis.  Stomach  and bowel loops otherwise unremarkable for technique.  No mass, adenopathy, free fluid, or inflammatory process.  Scattered atherosclerotic calcification.  No fractures.  IMPRESSION: Minimal atelectasis.  Small shoulder joint effusion with single tiny intra-articular bone fragment.  No acute intrathoracic, intra-abdominal or intrapelvic abnormalities.  Umbilical hernia containing fat.  Distal colonic diverticulosis.   Electronically Signed   By: Lavonia Dana M.D.   On: 10/08/2013 01:48   Ct Cervical Spine Wo Contrast  10/07/2013   CLINICAL DATA:  Fell 15-20 feet off a ladder landing in bushes, RIGHT shoulder pain/injury  EXAM: CT HEAD WITHOUT CONTRAST  CT CERVICAL SPINE WITHOUT CONTRAST  TECHNIQUE: Multidetector CT imaging of the head and cervical spine was performed following the standard protocol without intravenous contrast. Multiplanar CT image reconstructions of the cervical spine were also generated.  COMPARISON:  None  FINDINGS: CT HEAD FINDINGS  Minimal age-related atrophy.  Normal ventricular morphology.  No midline shift or mass effect.  Otherwise normal appearance of brain parenchyma.  No intracranial hemorrhage, mass lesion or evidence acute infarction.  No extra-axial fluid collections.  Bones is sinuses unremarkable.  CT CERVICAL SPINE FINDINGS  Prevertebral soft tissues normal thickness.  Disc space narrowing and endplate spur formation C6-C7, C4-C5.  Vertebral body heights maintained without fracture or subluxation.  Scattered facet degenerative changes greater in upper to mid cervical spine.  Visualized skullbase intact.  Atherosclerotic calcification in the LEFT carotid system.  Lung apices clear.  IMPRESSION: No acute intracranial abnormalities.  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  No acute cervical spine abnormalities.   Electronically Signed    By: Lavonia Dana M.D.   On: 10/07/2013 22:23   Ct Abdomen Pelvis W Contrast  10/08/2013   CLINICAL DATA:  [place] 25 feet from ladder that broke, shoulder injury  EXAM: CT CHEST, ABDOMEN, AND PELVIS WITH CONTRAST  TECHNIQUE: Multidetector CT imaging of the chest, abdomen and pelvis was performed following the standard protocol during bolus administration of intravenous contrast. Sagittal and coronal MPR images reconstructed from axial data set.  CONTRAST:  170m OMNIPAQUE IOHEXOL 300 MG/ML SOLN IV. No oral contrast administered.  COMPARISON:  None  FINDINGS: CT CHEST FINDINGS  Thoracic vascular structures patent on nondedicated exam.  No thoracic adenopathy or mediastinal hematoma.  Small RIGHT shoulder joint effusion with single tiny bone fragment intra-articular.  Dependent atelectasis in both lower lobes.  No acute infiltrate, pleural effusions, or pneumothorax.  No additional fractures identified.  CT ABDOMEN AND PELVIS FINDINGS  Liver, spleen, pancreas, kidneys, and adrenal glands normal appearance.  Normal appendix.  Umbilical hernia containing fat, fascial defect 2.0 x 1.4 cm.  Distal colonic diverticulosis without diverticulitis.  Stomach and bowel loops otherwise unremarkable for technique.  No mass, adenopathy, free fluid, or inflammatory process.  Scattered atherosclerotic calcification.  No fractures.  IMPRESSION: Minimal atelectasis.  Small shoulder joint effusion with single tiny intra-articular bone fragment.  No acute intrathoracic, intra-abdominal or intrapelvic abnormalities.  Umbilical hernia containing fat.  Distal colonic diverticulosis.   Electronically Signed   By: MLavonia DanaM.D.   On: 10/08/2013 01:48   Ct Ankle Left Wo Contrast  10/08/2013   CLINICAL DATA:  Follow up left calcaneal fracture.  EXAM: CT OF THE LEFT ANKLE WITHOUT CONTRAST  TECHNIQUE: Multidetector CT imaging was performed according to the standard protocol. Multiplanar CT image reconstructions were also generated.   COMPARISON:  Left ankle radiographs performed 10/07/2013  FINDINGS: There is a significantly comminuted fracture of the calcaneus, with  shear and compression fracture lines corresponding to a joint depression-type fracture. This is compatible with a Sanders type IV fracture, given the extent of comminution. The primary fracture lines are relatively lateral in nature.  Numerous small fragments are noted along the posterior facet. No additional fractures are seen. There is diffuse edema along the sinus tarsi. Diffuse soft tissue edema is seen about both sides of the ankle joint. The peroneus tendons pass directly adjacent to fracture fragments. The flexor and extensor tendons are grossly unremarkable in appearance. There is no definite evidence of vascular disruption. The Achilles tendon remains intact.  IMPRESSION: 1. Significantly comminuted fracture of the calcaneus, with shear and compression fracture lines corresponding to a joint depression-type fracture. This reflects a Sanders type IV fracture, given the extent of comminution; the primary fracture lines are relatively lateral in nature. 2. Diffuse soft tissue edema noted about the ankle, with edema along the sinus tarsi. 3. Peroneus tendons pass directly adjacent to fracture fragments, raising concern for increased potential for entrapment.   Electronically Signed   By: Garald Balding M.D.   On: 10/08/2013 01:50    Review of Systems  Constitutional: Negative.   HENT: Negative.   Eyes: Negative.   Respiratory: Negative.   Cardiovascular: Negative.   Gastrointestinal: Negative.   Genitourinary: Negative.   Musculoskeletal: Positive for joint pain.  Skin: Negative.   Neurological: Negative.   Endo/Heme/Allergies: Negative.   Psychiatric/Behavioral: Negative.     Blood pressure 107/56, pulse 68, temperature 98.5 F (36.9 C), temperature source Oral, resp. rate 22, height 5' 8"  (1.727 m), weight 111.131 kg (245 lb), SpO2 84.00%. Physical Exam   Constitutional: He appears well-developed.  HENT:  Head: Normocephalic.  Eyes: Pupils are equal, round, and reactive to light.  Neck: Normal range of motion.  Cardiovascular: Normal rate.   Respiratory: Effort normal.  Neurological: He is alert.  Skin: Skin is warm.  Psychiatric: He has a normal mood and affect.   examination the left ankle demonstrates palpable pedal pulses intact sensation on the dorsal and plantar aspect of the foot soft tissue swelling and bruising present posterior medial posterior lateral no fracture blisters are present compartments are soft and the cath no groin pain with internal extra rotation of either leg no knee effusion. Right shoulder is in a sling. He has painful range of motion and weakness but the deltoid does fire. Left arm demonstrates good range of motion wrist elbow and shoulder.  Assessment/Plan Impression is closed left calcaneal fracture and reduced right shoulder dislocation plan the patient will need to be admitted for possible placement. He will likely need surgical fixation of the calcaneus at the time to be determined by Dr. Sharol Given tomorrow he needs an MRI scan of his right shoulder to evaluate for rotator cuff tear. CT scan of head chest abdomen pelvis negative for acute injury. Plain radiographs of L-spine and T-spine also negative for compression fracture. The patient can eat breakfast in the morning. We'll plan for pain management elevation and splinting of the leg tonight.  Tonna Corner Dean 10/08/2013, 3:05 AM

## 2013-10-08 NOTE — Care Management Note (Signed)
    Page 1 of 2   10/12/2013     5:43:17 PM CARE MANAGEMENT NOTE 10/12/2013  Patient:  Harry Hall, Harry Hall   Account Number:  [id number]  Date Initiated:  10/08/2013  Documentation initiated by:  Tomi Bamberger  Subjective/Objective Assessment:   dx calcenous fx  admit lives with spouse.     Action/Plan:   pt eval- rec hhpt with dme hosp bed, tub transfer bench, bsc, w/chair with cushion with elevated leg rest.   Anticipated DC Date:  10/12/2013   Anticipated DC Plan:  [place]  CM consult      [facility] Choice  [place]   Choice offered to / List presented to:     DME arranged  [place]      DME agency  [place] arranged  HH-2 PT      [place] agency  OTHER - SEE NOTE   Status of service:  Completed, signed off Medicare Important Message given?   (If response is "NO", the following Medicare IM given date fields will be blank) Date Medicare IM given:   Date Additional Medicare IM given:    Discharge Disposition:  [place]  Per UR Regulation:  Reviewed for med. necessity/level of care/duration of stay  If discussed at [place] of Stay Meetings, dates discussed:    Comments:  10/12/13 [place] RN,BSN [phone] patient for dc today, per Helene Kelp with workmans comp, hhpt is set up with [place], NCM received call from [place] [phone] and fax [phone], NCM faxed infromation to [place].  Patient 's wife spoke with [facility] delivery and they are to deliver the dme to her home today which was set up by workmans comp.  10/11/13 [place], BSN [phone] NCM received call from Cipriano Bunker the [employer]' comp Case Manager, she was asking if patient was going to snf, NCM gave her CSW phone number to speak with adjuster.  NCM received call from occupational therapist  who spoke with Dr. Sharol Given, stating patient is doing excellent and will only need hhpt.  Patient's wife , Patricia's phone is [phone] (cell) for delivery of dme-  Hosp bed, tub transfer bench, bsc, w/chair with cushion with elevated leg rest.  NCM called Helene Kelp , the Case Manager with Workers Comp and left message that patient will now be going home with hh and will need an agency to set this up and agency to get DME. Awaiting call from Workers Comp Tourist information centre manager.  10/08/13 [place], BSN [phone] patient for MRI, orho MD to see patient to determine surgery.

## 2013-10-08 NOTE — ED Notes (Signed)
Attempted report 

## 2013-10-08 NOTE — ED Notes (Signed)
Pt alert, NAD, calm, interactive, resps e/u, speaking in clear complete sentences, VSS, (denies: pain, sob, nausea, dizziness or other sx), wife at [facility].

## 2013-10-08 NOTE — Progress Notes (Signed)
Orthopedic Tech Progress Note Patient Details:  Harry Hall [birth year] [id number]  Ortho Devices Type of Ortho Device: Post (short leg) splint   Katheren Shams 10/08/2013, 3:51 AM

## 2013-10-08 NOTE — ED Notes (Signed)
orthotech at [facility] for short L leg splint

## 2013-10-08 NOTE — Progress Notes (Signed)
Patient ID: Harry Hall, male   DOB: 11/24/1950, 63 y.o.   MRN: [id number] Patient with displaced posterior facet 2 part fracture. Plan for surgical intervention tomorrow morning with open reduction internal fixation of the left calcaneus fracture. N.p.o. after midnight. Patient will need to sign consent. 

## 2013-10-08 NOTE — Progress Notes (Signed)
UR completed 

## 2013-10-08 NOTE — Progress Notes (Signed)
ANTICOAGULATION CONSULT NOTE - Initial Consult  Pharmacy Consult for LMWH Indication: VTE prophylaxis  No Known Allergies  Patient Measurements: Height: 5\' 8"  (172.7 cm) Weight: 248 lb 7.3 oz (112.7 kg) IBW/kg (Calculated) : 68.4   Vital Signs: Temp: 98.7 F (37.1 C) (05/29 0610) Temp src: Oral (05/29 0610) BP: 110/71 mmHg (05/29 0610) Pulse Rate: 66 (05/29 0610)  Labs:  Recent Labs  10/07/13 1949  HGB 14.7  HCT 44.2  PLT 265  CREATININE 0.84    Estimated Creatinine Clearance: 112.5 ml/min (by C-G formula based on Cr of 0.84).   Medical History: Past Medical History  Diagnosis Date  . Hyperlipidemia     Medications:  Prescriptions prior to admission  Medication Sig Dispense Refill  . aspirin 81 MG tablet Take 81 mg by mouth daily.        Marland Kitchen atorvastatin (LIPITOR) 80 MG tablet Take 80 mg by mouth daily.        . cetirizine (ZYRTEC) 10 MG tablet Take 10 mg by mouth daily.      . fish oil-omega-3 fatty acids 1000 MG capsule Take 1 g by mouth daily.       Marland Kitchen ibuprofen (ADVIL,MOTRIN) 200 MG tablet Take 600 mg by mouth every 6 (six) hours as needed for headache.        Assessment: 63 yo M status post a 20 foot fall off of a ladder.  Right shoulder was dislocated in the emergency room was reduced. left calcaneal fracture to be evaluated by Dr. Sharol Given today. Dr. Marlou Sa has ordered sq heparin for VTE px today and has ordered for pharmacy to start [place] for VTE px starting 5/30 am.  Wt 112.7 kg; creat 0.84, CBC WNL.  Goal of Therapy: Monitor platelets by anticoagulation protocol: Yes   Plan:  1. Heparin 5000 SQ q8h until 5/30 at 0600 last dose 2. LMWH 30 q12 (trauma dose for 113 kg pt) to start 5/30 at 12 noon  Eudelia Bunch, Pharm.D. [phone] 10/08/2013 1:50 PM

## 2013-10-08 NOTE — Progress Notes (Signed)
Pt stable vss Foot left and shoulder Splinted On sub cut hep  duda to eval heel today Pt will likely be here over the weekend Mri shoulder pending Will change to lovenox sat

## 2013-10-08 NOTE — ED Provider Notes (Signed)
Care and assumed from Dr. Darl Householder.  Patient is status post a 20 foot fall off of a ladder.  Patient had had his right shoulder dislocation reduced by Dr. Darl Householder.  He has a left calcaneus fracture.  Orthopedics has been counseled and requests further imaging.  Plan for orthopedic evaluation after completion of scans.  Patient evaluated, reports that he is doing well pain controlled.  He has no requests or complaints at this time.  Results for orders placed during the hospital encounter of 10/07/13  CBC WITH DIFFERENTIAL      Result Value Ref Range   WBC 9.0  4.0 - 10.5 K/uL   RBC 4.69  4.22 - 5.81 MIL/uL   Hemoglobin 14.7  13.0 - 17.0 g/dL   HCT 44.2  39.0 - 52.0 %   MCV 94.2  78.0 - 100.0 fL   MCH 31.3  26.0 - 34.0 pg   MCHC 33.3  30.0 - 36.0 g/dL   RDW 13.6  11.5 - 15.5 %   Platelets 265  150 - 400 K/uL   Neutrophils Relative % 73  43 - 77 %   Neutro Abs 6.6  1.7 - 7.7 K/uL   Lymphocytes Relative 20  12 - 46 %   Lymphs Abs 1.8  0.7 - 4.0 K/uL   Monocytes Relative 6  3 - 12 %   Monocytes Absolute 0.6  0.1 - 1.0 K/uL   Eosinophils Relative 1  0 - 5 %   Eosinophils Absolute 0.1  0.0 - 0.7 K/uL   Basophils Relative 0  0 - 1 %   Basophils Absolute 0.0  0.0 - 0.1 K/uL  COMPREHENSIVE METABOLIC PANEL      Result Value Ref Range   Sodium 143  137 - 147 mEq/L   Potassium 3.9  3.7 - 5.3 mEq/L   Chloride 104  96 - 112 mEq/L   CO2 25  19 - 32 mEq/L   Glucose, Bld 117 (*) 70 - 99 mg/dL   BUN 13  6 - 23 mg/dL   Creatinine, Ser 0.84  0.50 - 1.35 mg/dL   Calcium 9.7  8.4 - 10.5 mg/dL   Total Protein 6.9  6.0 - 8.3 g/dL   Albumin 4.1  3.5 - 5.2 g/dL   AST 18  0 - 37 U/L   ALT 26  0 - 53 U/L   Alkaline Phosphatase 57  39 - 117 U/L   Total Bilirubin 0.5  0.3 - 1.2 mg/dL   GFR calc non Af Amer >90  >90 mL/min   GFR calc Af Amer >90  >90 mL/min   Dg Chest 2 View  10/07/2013   CLINICAL DATA:  25 foot fall  EXAM: CHEST  2 VIEW  COMPARISON:  Concurrently obtained radiographs of the bilateral shoulders   FINDINGS: Incompletely imaged right inferior shoulder dislocation. Inspiratory volumes are low and there may be mild bibasilar atelectasis. No pneumothorax or large pleural effusion. No displaced rib fracture. The vertebral body heights are maintained on the lateral view. Mild multilevel degenerative spurring.  IMPRESSION: 1. Low inspiratory volumes and bibasilar subsegmental atelectasis. 2. Otherwise, no acute cardiopulmonary process. 3. Incompletely imaged right anteroinferior shoulder dislocation.   Electronically Signed   By: Jacqulynn Cadet M.D.   On: 10/07/2013 21:48   Dg Thoracic Spine 2 View  10/08/2013   CLINICAL DATA:  Trauma, fall  EXAM: THORACIC SPINE - 2 VIEW  COMPARISON:  None  FINDINGS: Twelve pairs of ribs, less pair hypoplastic.  Mild biconvex thoracic scoliosis.  Multilevel disc space narrowing and endplate spur formation.  Vertebral body heights maintained without fracture or subluxation.  No bone destruction.  Visualized posterior ribs appear intact.  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  No acute abnormalities.   Electronically Signed   By: Lavonia Dana M.D.   On: 10/08/2013 00:10   Dg Lumbar Spine Complete  10/08/2013   CLINICAL DATA:  Trauma, fall, shoulder injury, ankle pain  EXAM: LUMBAR SPINE - COMPLETE 4+ VIEW  COMPARISON:  None  FINDINGS: Osseous mineralization grossly normal.  Hypoplastic last rib pair.  Five non-rib-bearing lumbar vertebrae.  Scattered endplate spur formation at the lower thoracic and upper lumbar spine.  Vertebral body heights maintained without fracture or subluxation.  No bone destruction or spondylolysis.  SI joints symmetric.  IMPRESSION: Degenerative disc disease changes lower thoracic and upper lumbar spine.  No acute abnormalities.   Electronically Signed   By: Lavonia Dana M.D.   On: 10/08/2013 00:09   Dg Pelvis 1-2 Views  10/08/2013   CLINICAL DATA:  Trauma, fall, shoulder injury, ankle injury  EXAM:  PELVIS - 1-2 VIEW  COMPARISON:  None  FINDINGS: Hip and SI joints symmetric and preserved.  Osseous mineralization grossly normal.  No acute fracture, dislocation or bone destruction.  IMPRESSION: No acute osseous abnormalities.   Electronically Signed   By: Lavonia Dana M.D.   On: 10/08/2013 00:08   Dg Shoulder Right  10/08/2013   CLINICAL DATA:  Status post reduction of right humeral head dislocation.  EXAM: RIGHT SHOULDER - 2+ VIEW  COMPARISON:  Right shoulder radiographs performed earlier today at 9:26 p.m.  FINDINGS: There has been successful reduction of the patient's right humeral head. A Hill-Sachs lesion is noted. There also appears to be a displaced osseous Bankart lesion. No additional fractures are seen. The right acromioclavicular joint is unremarkable in appearance. The visualized portions of the right lung are clear. No significant soft tissue abnormalities are characterized on radiograph.  IMPRESSION: Successful reduction of right humeral head. Hill-Sachs lesion noted. There also appears to be a displaced osseous Bankart lesion.   Electronically Signed   By: Garald Balding M.D.   On: 10/08/2013 00:11   Dg Shoulder Right  10/07/2013   CLINICAL DATA:  [place] 25 foot from a ladder that broke, RIGHT shoulder pain  EXAM: RIGHT SHOULDER - 2+ VIEW  COMPARISON:  None  FINDINGS: Osseous mineralization normal for technique.  AC joint alignment normal.  Question os acromiale.  Anterior glenohumeral dislocation.  No definite fracture.  Visualized RIGHT ribs intact.  IMPRESSION: Anterior RIGHT glenohumeral dislocation.   Electronically Signed   By: Lavonia Dana M.D.   On: 10/07/2013 21:49   Dg Ankle Complete Left  10/07/2013   CLINICAL DATA:  25 foot fall  EXAM: LEFT ANKLE COMPLETE - 3+ VIEW  COMPARISON:  None.  FINDINGS: Comminuted fracture through the mid aspect of the calcaneus. There is associated soft tissue swelling and infiltration of Kager's fat pad. Fracture line extends into the articular surface  of the subtalar space. Focal lucency in the medial aspect of the talar dome concerning for acute osteochondral defect. The distal fibula and tibia appear intact.  IMPRESSION: 1. Comminuted and impacted fracture through the mid calcaneus. 2. Suspect acute osteochondral defect of the medial aspect of the talar dome. 3. Extensive soft tissue swelling about the ankle.   Electronically Signed   By: Jacqulynn Cadet M.D.   On: 10/07/2013  21:50   Ct Head Wo Contrast  10/07/2013   CLINICAL DATA:  Fell 15-20 feet off a ladder landing in bushes, RIGHT shoulder pain/injury  EXAM: CT HEAD WITHOUT CONTRAST  CT CERVICAL SPINE WITHOUT CONTRAST  TECHNIQUE: Multidetector CT imaging of the head and cervical spine was performed following the standard protocol without intravenous contrast. Multiplanar CT image reconstructions of the cervical spine were also generated.  COMPARISON:  None  FINDINGS: CT HEAD FINDINGS  Minimal age-related atrophy.  Normal ventricular morphology.  No midline shift or mass effect.  Otherwise normal appearance of brain parenchyma.  No intracranial hemorrhage, mass lesion or evidence acute infarction.  No extra-axial fluid collections.  Bones is sinuses unremarkable.  CT CERVICAL SPINE FINDINGS  Prevertebral soft tissues normal thickness.  Disc space narrowing and endplate spur formation C6-C7, C4-C5.  Vertebral body heights maintained without fracture or subluxation.  Scattered facet degenerative changes greater in upper to mid cervical spine.  Visualized skullbase intact.  Atherosclerotic calcification in the LEFT carotid system.  Lung apices clear.  IMPRESSION: No acute intracranial abnormalities.  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  No acute cervical spine abnormalities.   Electronically Signed   By: Lavonia Dana M.D.   On: 10/07/2013 22:23   Ct Chest W Contrast  10/08/2013   CLINICAL DATA:  [place] 25 feet from ladder that broke, shoulder injury  EXAM: CT CHEST,  ABDOMEN, AND PELVIS WITH CONTRAST  TECHNIQUE: Multidetector CT imaging of the chest, abdomen and pelvis was performed following the standard protocol during bolus administration of intravenous contrast. Sagittal and coronal MPR images reconstructed from axial data set.  CONTRAST:  167mL OMNIPAQUE IOHEXOL 300 MG/ML SOLN IV. No oral contrast administered.  COMPARISON:  None  FINDINGS: CT CHEST FINDINGS  Thoracic vascular structures patent on nondedicated exam.  No thoracic adenopathy or mediastinal hematoma.  Small RIGHT shoulder joint effusion with single tiny bone fragment intra-articular.  Dependent atelectasis in both lower lobes.  No acute infiltrate, pleural effusions, or pneumothorax.  No additional fractures identified.  CT ABDOMEN AND PELVIS FINDINGS  Liver, spleen, pancreas, kidneys, and adrenal glands normal appearance.  Normal appendix.  Umbilical hernia containing fat, fascial defect 2.0 x 1.4 cm.  Distal colonic diverticulosis without diverticulitis.  Stomach and bowel loops otherwise unremarkable for technique.  No mass, adenopathy, free fluid, or inflammatory process.  Scattered atherosclerotic calcification.  No fractures.  IMPRESSION: Minimal atelectasis.  Small shoulder joint effusion with single tiny intra-articular bone fragment.  No acute intrathoracic, intra-abdominal or intrapelvic abnormalities.  Umbilical hernia containing fat.  Distal colonic diverticulosis.   Electronically Signed   By: Lavonia Dana M.D.   On: 10/08/2013 01:48   Ct Cervical Spine Wo Contrast  10/07/2013   CLINICAL DATA:  Fell 15-20 feet off a ladder landing in bushes, RIGHT shoulder pain/injury  EXAM: CT HEAD WITHOUT CONTRAST  CT CERVICAL SPINE WITHOUT CONTRAST  TECHNIQUE: Multidetector CT imaging of the head and cervical spine was performed following the standard protocol without intravenous contrast. Multiplanar CT image reconstructions of the cervical spine were also generated.  COMPARISON:  None  FINDINGS: CT HEAD  FINDINGS  Minimal age-related atrophy.  Normal ventricular morphology.  No midline shift or mass effect.  Otherwise normal appearance of brain parenchyma.  No intracranial hemorrhage, mass lesion or evidence acute infarction.  No extra-axial fluid collections.  Bones is sinuses unremarkable.  CT CERVICAL SPINE FINDINGS  Prevertebral soft tissues normal thickness.  Disc space narrowing and endplate spur formation  C6-C7, C4-C5.  Vertebral body heights maintained without fracture or subluxation.  Scattered facet degenerative changes greater in upper to mid cervical spine.  Visualized skullbase intact.  Atherosclerotic calcification in the LEFT carotid system.  Lung apices clear.  IMPRESSION: No acute intracranial abnormalities.  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  No acute cervical spine abnormalities.   Electronically Signed   By: Ulyses Southward M.D.   On: 10/07/2013 22:23   Ct Abdomen Pelvis W Contrast  10/08/2013   CLINICAL DATA:  [place] 25 feet from ladder that broke, shoulder injury  EXAM: CT CHEST, ABDOMEN, AND PELVIS WITH CONTRAST  TECHNIQUE: Multidetector CT imaging of the chest, abdomen and pelvis was performed following the standard protocol during bolus administration of intravenous contrast. Sagittal and coronal MPR images reconstructed from axial data set.  CONTRAST:  OMNIPAQUE IOHEXOL 300 MG/ML SOLN IV. No oral contrast administered.  COMPARISON:  None  FINDINGS: CT CHEST FINDINGS  Thoracic vascular structures patent on nondedicated exam.  No thoracic adenopathy or mediastinal hematoma.  Small RIGHT shoulder joint effusion with single tiny bone fragment intra-articular.  Dependent atelectasis in both lower lobes.  No acute infiltrate, pleural effusions, or pneumothorax.  No additional fractures identified.  CT ABDOMEN AND PELVIS FINDINGS  Liver, spleen, pancreas, kidneys, and adrenal glands normal appearance.  Normal appendix.  Umbilical hernia containing fat,  fascial defect 2.0 x 1.4 cm.  Distal colonic diverticulosis without diverticulitis.  Stomach and bowel loops otherwise unremarkable for technique.  No mass, adenopathy, free fluid, or inflammatory process.  Scattered atherosclerotic calcification.  No fractures.  IMPRESSION: Minimal atelectasis.  Small shoulder joint effusion with single tiny intra-articular bone fragment.  No acute intrathoracic, intra-abdominal or intrapelvic abnormalities.  Umbilical hernia containing fat.  Distal colonic diverticulosis.   Electronically Signed   By: Ulyses Southward M.D.   On: 10/08/2013 01:48   Ct Ankle Left Wo Contrast  10/08/2013   CLINICAL DATA:  Follow up left calcaneal fracture.  EXAM: CT OF THE LEFT ANKLE WITHOUT CONTRAST  TECHNIQUE: Multidetector CT imaging was performed according to the standard protocol. Multiplanar CT image reconstructions were also generated.  COMPARISON:  Left ankle radiographs performed 10/07/2013  FINDINGS: There is a significantly comminuted fracture of the calcaneus, with shear and compression fracture lines corresponding to a joint depression-type fracture. This is compatible with a Sanders type IV fracture, given the extent of comminution. The primary fracture lines are relatively lateral in nature.  Numerous small fragments are noted along the posterior facet. No additional fractures are seen. There is diffuse edema along the sinus tarsi. Diffuse soft tissue edema is seen about both sides of the ankle joint. The peroneus tendons pass directly adjacent to fracture fragments. The flexor and extensor tendons are grossly unremarkable in appearance. There is no definite evidence of vascular disruption. The Achilles tendon remains intact.  IMPRESSION: 1. Significantly comminuted fracture of the calcaneus, with shear and compression fracture lines corresponding to a joint depression-type fracture. This reflects a Sanders type IV fracture, given the extent of comminution; the primary fracture lines are  relatively lateral in nature. 2. Diffuse soft tissue edema noted about the ankle, with edema along the sinus tarsi. 3. Peroneus tendons pass directly adjacent to fracture fragments, raising concern for increased potential for entrapment.   Electronically Signed   By: Roanna Raider M.D.   On: 10/08/2013 01:50      Olivia Mackie, MD 10/08/13 [phone]

## 2013-10-08 NOTE — ED Notes (Signed)
Report given to primary receiving RN 5W 17, pt/family updated. Splint in place LLE, sling in place RUE. CMS intact, cap refill on both injured extremities <3sec. Rates pain 3/10. Up to floor. Wife at [facility]. Remains alert, NAD, calm, interactive.

## 2013-10-09 ENCOUNTER — Encounter (HOSPITAL_COMMUNITY): Payer: Worker's Compensation | Admitting: Anesthesiology

## 2013-10-09 ENCOUNTER — Observation Stay (HOSPITAL_COMMUNITY): Payer: Worker's Compensation | Admitting: Anesthesiology

## 2013-10-09 ENCOUNTER — Encounter (HOSPITAL_COMMUNITY)
Admission: EM | Disposition: A | Discharge status: Skilled Nursing Facility | Payer: Self-pay | Source: Home / Self Care | Attending: Emergency Medicine

## 2013-10-09 HISTORY — PX: FRACTURE SURGERY: SHX138

## 2013-10-09 HISTORY — PX: ORIF CALCANEOUS FRACTURE: SHX5030

## 2013-10-09 SURGERY — OPEN REDUCTION INTERNAL FIXATION (ORIF) CALCANEOUS FRACTURE
Anesthesia: Regional | Site: Ankle | Laterality: Left

## 2013-10-09 MED ORDER — PROPOFOL 10 MG/ML IV BOLUS
INTRAVENOUS | Status: AC
  Filled 2013-10-09: qty 20

## 2013-10-09 MED ORDER — DEXTROSE 5 % IV SOLN
500.0000 mg | Freq: Four times a day (QID) | INTRAVENOUS | Status: DC | PRN
Start: 2013-10-09 — End: 2013-10-12
  Filled 2013-10-09: qty 5

## 2013-10-09 MED ORDER — ONDANSETRON HCL 4 MG/2ML IJ SOLN
INTRAMUSCULAR | Status: AC
  Filled 2013-10-09: qty 2

## 2013-10-09 MED ORDER — METOCLOPRAMIDE HCL 5 MG/ML IJ SOLN
5.0000 mg | Freq: Three times a day (TID) | INTRAMUSCULAR | Status: DC | PRN
Start: 2013-10-09 — End: 2013-10-12
  Filled 2013-10-09: qty 2

## 2013-10-09 MED ORDER — OXYCODONE-ACETAMINOPHEN 5-325 MG PO TABS
1.0000 | ORAL_TABLET | ORAL | Status: DC | PRN
  Administered 2013-10-09 – 2013-10-11 (×5): 2 via ORAL
  Filled 2013-10-09 (×6): qty 2

## 2013-10-09 MED ORDER — DOCUSATE SODIUM 100 MG PO CAPS
100.0000 mg | ORAL_CAPSULE | Freq: Two times a day (BID) | ORAL | Status: DC
  Administered 2013-10-09 – 2013-10-12 (×5): 100 mg via ORAL
  Filled 2013-10-09 (×9): qty 1

## 2013-10-09 MED ORDER — LIDOCAINE HCL (CARDIAC) 20 MG/ML IV SOLN
INTRAVENOUS | Status: DC | PRN
  Administered 2013-10-09: 20 mg via INTRAVENOUS

## 2013-10-09 MED ORDER — BUPIVACAINE HCL (PF) 0.5 % IJ SOLN
INTRAMUSCULAR | Status: DC | PRN
  Administered 2013-10-09: 5 mL

## 2013-10-09 MED ORDER — SODIUM CHLORIDE 0.9 % IJ SOLN
INTRAMUSCULAR | Status: AC
  Filled 2013-10-09: qty 10

## 2013-10-09 MED ORDER — ONDANSETRON HCL 4 MG PO TABS
4.0000 mg | ORAL_TABLET | Freq: Four times a day (QID) | ORAL | Status: DC | PRN
Start: 2013-10-09 — End: 2013-10-12

## 2013-10-09 MED ORDER — MIDAZOLAM HCL 5 MG/5ML IJ SOLN
INTRAMUSCULAR | Status: DC | PRN
  Administered 2013-10-09: 2 mg via INTRAVENOUS

## 2013-10-09 MED ORDER — GLYCOPYRROLATE 0.2 MG/ML IJ SOLN
INTRAMUSCULAR | Status: DC | PRN
  Administered 2013-10-09: .4 mg via INTRAVENOUS

## 2013-10-09 MED ORDER — BUPIVACAINE-EPINEPHRINE (PF) 0.5% -1:200000 IJ SOLN
INTRAMUSCULAR | Status: DC | PRN
  Administered 2013-10-09: 30 mL

## 2013-10-09 MED ORDER — HYDROMORPHONE HCL PF 1 MG/ML IJ SOLN: 0.2500 mg | INTRAMUSCULAR | Status: DC | PRN

## 2013-10-09 MED ORDER — ASPIRIN EC 325 MG PO TBEC
325.0000 mg | DELAYED_RELEASE_TABLET | Freq: Every day | ORAL | Status: DC
  Administered 2013-10-09 – 2013-10-10 (×2): 325 mg via ORAL
  Filled 2013-10-09 (×3): qty 1

## 2013-10-09 MED ORDER — METOCLOPRAMIDE HCL 10 MG PO TABS
5.0000 mg | ORAL_TABLET | Freq: Three times a day (TID) | ORAL | Status: DC | PRN
Start: 2013-10-09 — End: 2013-10-12

## 2013-10-09 MED ORDER — LIDOCAINE HCL 4 % MT SOLN
OROMUCOSAL | Status: DC | PRN
Start: 2013-10-09 — End: 2013-10-09
  Administered 2013-10-09: 4 mL via TOPICAL

## 2013-10-09 MED ORDER — LACTATED RINGERS IV SOLN
INTRAVENOUS | Status: DC | PRN
  Administered 2013-10-09 (×2): via INTRAVENOUS

## 2013-10-09 MED ORDER — ONDANSETRON HCL 4 MG/2ML IJ SOLN: 4.0000 mg | Freq: Four times a day (QID) | INTRAMUSCULAR | Status: DC | PRN

## 2013-10-09 MED ORDER — ROCURONIUM BROMIDE 100 MG/10ML IV SOLN
INTRAVENOUS | Status: DC | PRN
  Administered 2013-10-09: 50 mg via INTRAVENOUS

## 2013-10-09 MED ORDER — SODIUM CHLORIDE 0.9 % IV SOLN: INTRAVENOUS | Status: DC

## 2013-10-09 MED ORDER — LIDOCAINE HCL (CARDIAC) 20 MG/ML IV SOLN
INTRAVENOUS | Status: AC
  Filled 2013-10-09: qty 5

## 2013-10-09 MED ORDER — OXYCODONE HCL 5 MG/5ML PO SOLN: 5.0000 mg | Freq: Once | ORAL | Status: DC | PRN

## 2013-10-09 MED ORDER — HYDROMORPHONE HCL PF 1 MG/ML IJ SOLN: 0.5000 mg | INTRAMUSCULAR | Status: DC | PRN

## 2013-10-09 MED ORDER — METHOCARBAMOL 500 MG PO TABS
500.0000 mg | ORAL_TABLET | Freq: Four times a day (QID) | ORAL | Status: DC | PRN
  Filled 2013-10-09: qty 1

## 2013-10-09 MED ORDER — ROCURONIUM BROMIDE 50 MG/5ML IV SOLN
INTRAVENOUS | Status: AC
  Filled 2013-10-09: qty 1

## 2013-10-09 MED ORDER — CEFAZOLIN SODIUM-DEXTROSE 2-3 GM-% IV SOLR
2.0000 g | Freq: Four times a day (QID) | INTRAVENOUS | Status: AC
  Administered 2013-10-09 – 2013-10-10 (×3): 2 g via INTRAVENOUS
  Filled 2013-10-09 (×3): qty 50

## 2013-10-09 MED ORDER — OXYCODONE HCL 5 MG PO TABS: 5.0000 mg | ORAL_TABLET | Freq: Once | ORAL | Status: DC | PRN

## 2013-10-09 MED ORDER — MIDAZOLAM HCL 2 MG/2ML IJ SOLN
INTRAMUSCULAR | Status: AC
  Filled 2013-10-09: qty 2

## 2013-10-09 MED ORDER — NEOSTIGMINE METHYLSULFATE 10 MG/10ML IV SOLN
INTRAVENOUS | Status: DC | PRN
  Administered 2013-10-09: 3 mg via INTRAVENOUS

## 2013-10-09 MED ORDER — OXYCODONE-ACETAMINOPHEN 5-325 MG PO TABS
ORAL_TABLET | ORAL | Status: AC
  Administered 2013-10-09: 2
  Filled 2013-10-09: qty 2

## 2013-10-09 MED ORDER — 0.9 % SODIUM CHLORIDE (POUR BTL) OPTIME
TOPICAL | Status: DC | PRN
  Administered 2013-10-09: 1000 mL

## 2013-10-09 MED ORDER — PROPOFOL 10 MG/ML IV BOLUS
INTRAVENOUS | Status: DC | PRN
  Administered 2013-10-09: 100 mg via INTRAVENOUS
  Administered 2013-10-09: 200 mg via INTRAVENOUS

## 2013-10-09 MED ORDER — SUFENTANIL CITRATE 50 MCG/ML IV SOLN
INTRAVENOUS | Status: AC
Start: 2013-10-09 — End: 2013-10-09
  Filled 2013-10-09: qty 1

## 2013-10-09 MED ORDER — SUFENTANIL CITRATE 50 MCG/ML IV SOLN
INTRAVENOUS | Status: DC | PRN
  Administered 2013-10-09 (×4): 5 ug via INTRAVENOUS

## 2013-10-09 MED ORDER — CEFAZOLIN SODIUM-DEXTROSE 2-3 GM-% IV SOLR
INTRAVENOUS | Status: AC
  Filled 2013-10-09: qty 50

## 2013-10-09 SURGICAL SUPPLY — 53 items
BANDAGE ELASTIC 4 VELCRO ST LF (GAUZE/BANDAGES/DRESSINGS) IMPLANT
BANDAGE ELASTIC 6 VELCRO ST LF (GAUZE/BANDAGES/DRESSINGS) IMPLANT
BANDAGE ESMARK 6X9 LF (GAUZE/BANDAGES/DRESSINGS) IMPLANT
BIT DRILL LCP QC 2X140 (BIT) ×2 IMPLANT
BNDG CMPR 9X6 STRL LF SNTH (GAUZE/BANDAGES/DRESSINGS)
BNDG COHESIVE 6X5 TAN STRL LF (GAUZE/BANDAGES/DRESSINGS) ×4 IMPLANT
BNDG ESMARK 6X9 LF (GAUZE/BANDAGES/DRESSINGS)
BNDG GAUZE ELAST 4 BULKY (GAUZE/BANDAGES/DRESSINGS) ×3 IMPLANT
BNDG GAUZE STRTCH 6 (GAUZE/BANDAGES/DRESSINGS) ×3 IMPLANT
COTTON STERILE ROLL (GAUZE/BANDAGES/DRESSINGS) ×1 IMPLANT
COVER SURGICAL LIGHT HANDLE (MISCELLANEOUS) ×3 IMPLANT
CUFF TOURNIQUET SINGLE 34IN LL (TOURNIQUET CUFF) IMPLANT
CUFF TOURNIQUET SINGLE 44IN (TOURNIQUET CUFF) IMPLANT
DRAPE INCISE IOBAN 66X45 STRL (DRAPES) ×3 IMPLANT
DRAPE OEC MINIVIEW 54X84 (DRAPES) ×3 IMPLANT
DRAPE PROXIMA HALF (DRAPES) ×3 IMPLANT
DRAPE U-SHAPE 47X51 STRL (DRAPES) ×3 IMPLANT
DRSG ADAPTIC 3X8 NADH LF (GAUZE/BANDAGES/DRESSINGS) ×3 IMPLANT
DURAPREP 26ML APPLICATOR (WOUND CARE) ×3 IMPLANT
ELECT REM PT RETURN 9FT ADLT (ELECTROSURGICAL) ×3
ELECTRODE REM PT RTRN 9FT ADLT (ELECTROSURGICAL) ×1 IMPLANT
GLOVE BIOGEL PI IND STRL 9 (GLOVE) ×1 IMPLANT
GLOVE BIOGEL PI INDICATOR 9 (GLOVE) ×2
GLOVE SURG ORTHO 9.0 STRL STRW (GLOVE) ×5 IMPLANT
GOWN STRL REUS W/ TWL XL LVL3 (GOWN DISPOSABLE) ×3 IMPLANT
GOWN STRL REUS W/TWL XL LVL3 (GOWN DISPOSABLE) ×6
JOYSTICK REDUCTION 5.0 (MISCELLANEOUS) ×2 IMPLANT
KIT BASIN OR (CUSTOM PROCEDURE TRAY) ×3 IMPLANT
KIT ROOM TURNOVER OR (KITS) ×3 IMPLANT
MANIFOLD NEPTUNE II (INSTRUMENTS) ×3 IMPLANT
NS IRRIG 1000ML POUR BTL (IV SOLUTION) ×3 IMPLANT
PACK ORTHO EXTREMITY (CUSTOM PROCEDURE TRAY) ×3 IMPLANT
PAD ARMBOARD 7.5X6 YLW CONV (MISCELLANEOUS) ×6 IMPLANT
PADDING CAST COTTON 6X4 STRL (CAST SUPPLIES) ×3 IMPLANT
PIN CENTERING 1.6MM (PIN) ×2 IMPLANT
PLATE CALCANEAL LOCKING 2.7MM (Plate) ×2 IMPLANT
SCREW LOCKING 2.7X28 (Screw) ×2 IMPLANT
SCREW LOCKING VA 2.7X40MM (Screw) ×2 IMPLANT
SCREW LOCKING VA 2.7X42 (Screw) ×6 IMPLANT
SCREW METAPHYSEAL 2.7X28 (Screw) ×2 IMPLANT
SCREW METAPHYSEAL 2.7X30 (Screw) ×4 IMPLANT
SCREW SELF-TAP 2.7X26MM (Screw) ×4 IMPLANT
SPONGE GAUZE 4X4 12PLY (GAUZE/BANDAGES/DRESSINGS) ×3 IMPLANT
SPONGE LAP 18X18 X RAY DECT (DISPOSABLE) ×5 IMPLANT
STAPLER VISISTAT 35W (STAPLE) IMPLANT
SUCTION FRAZIER TIP 10 FR DISP (SUCTIONS) ×3 IMPLANT
SUT ETHILON 2 0 PSLX (SUTURE) ×4 IMPLANT
SUT VIC AB 2-0 CTB1 (SUTURE) ×6 IMPLANT
TOWEL OR 17X24 6PK STRL BLUE (TOWEL DISPOSABLE) ×3 IMPLANT
TOWEL OR 17X26 10 PK STRL BLUE (TOWEL DISPOSABLE) ×3 IMPLANT
TUBE CONNECTING 12'X1/4 (SUCTIONS) ×1
TUBE CONNECTING 12X1/4 (SUCTIONS) ×2 IMPLANT
WATER STERILE IRR 1000ML POUR (IV SOLUTION) ×3 IMPLANT

## 2013-10-09 NOTE — Progress Notes (Signed)
To OR  Via bed with IV ancef & TEDS with/ pt.

## 2013-10-09 NOTE — Addendum Note (Signed)
Addendum created 10/09/13 1405 by Izora Gala, CRNA   Modules edited: Anesthesia Medication Administration

## 2013-10-09 NOTE — Interval H&P Note (Signed)
History and Physical Interval Note:  10/09/2013 5:59 AM  Harry Hall  has presented today for surgery, with the diagnosis of Left Calcaneus Fracture  The various methods of treatment have been discussed with the patient and family. After consideration of risks, benefits and other options for treatment, the patient has consented to  Procedure(s) with comments: Open Reduction Internal Fixation Left Calcaneus Fracture (Left) - Open Reduction Internal Fixation Left Calcaneus Fracture as a surgical intervention .  The patient's history has been reviewed, patient examined, no change in status, stable for surgery.  I have reviewed the patient's chart and labs.  Questions were answered to the patient's satisfaction.     [name]

## 2013-10-09 NOTE — Anesthesia Postprocedure Evaluation (Signed)
  Anesthesia Post-op Note  Patient: Harry Hall  Procedure(s) Performed: Procedure(s) with comments: Open Reduction Internal Fixation Left Calcaneus Fracture (Left) - Open Reduction Internal Fixation Left Calcaneus Fracture  Patient Location: PACU  Anesthesia Type:General and block  Level of Consciousness: awake and alert   Airway and Oxygen Therapy: Patient Spontanous Breathing  Post-op Pain: none  Post-op Assessment: Post-op Vital signs reviewed, Patient's Cardiovascular Status Stable and Respiratory Function Stable  Post-op Vital Signs: Reviewed  Filed Vitals:   10/09/13 0917  BP: 110/54  Pulse: 83  Temp: 37.1 C  Resp: 18    Complications: No apparent anesthesia complications

## 2013-10-09 NOTE — Progress Notes (Signed)
PT Cancellation Note  Patient Details Name: Harry Hall MRN: [id number] DOB: 31-Oct-1950   Cancelled Treatment:    Reason Eval/Treat Not Completed: Patient at procedure or test/unavailable   Duncan Dull 10/09/2013, [place], PT DPT  [phone]

## 2013-10-09 NOTE — Anesthesia Procedure Notes (Addendum)
Anesthesia Regional Block:  Popliteal block  Pre-Anesthetic Checklist: ,, timeout performed, Correct Patient, Correct Site, Correct Laterality, Correct Procedure, Correct Position, site marked, Risks and benefits discussed, pre-op evaluation, post-op pain management  Laterality: Left  Prep: Maximum Sterile Barrier Precautions used and chloraprep       Needles:  Injection technique: Single-shot  Needle Type: Echogenic Stimulator Needle     Needle Length: 9cm 9 cm Needle Gauge: 21 and 21 G    Additional Needles:  Procedures: ultrasound guided (picture in chart) and nerve stimulator Popliteal block  Nerve Stimulator or Paresthesia:  Response: Peroneal,  Response: Tibial,   Additional Responses:   Narrative:  Start time: 10/09/2013 7:00 AM End time: 10/09/2013 7:17 AM Injection made incrementally with aspirations every 5 mL. Anesthesiologist: Ola Spurr, MD  Additional Notes: 2% Lidocaine skin wheel.   Procedure Name: Intubation Date/Time: 10/09/2013 7:40 AM Performed by: Izora Gala Pre-anesthesia Checklist: Patient identified, Emergency Drugs available, Suction available and Patient being monitored Patient Re-evaluated:Patient Re-evaluated prior to inductionOxygen Delivery Method: Circle system utilized Preoxygenation: Pre-oxygenation with 100% oxygen Intubation Type: IV induction Ventilation: Two handed mask ventilation required and Oral airway inserted - appropriate to patient size Laryngoscope Size: Miller and 3 Grade View: Grade I Tube type: Oral Tube size: 8.0 mm Number of attempts: 1 Airway Equipment and Method: Stylet Placement Confirmation: ETT inserted through vocal cords under direct vision,  positive ETCO2 and breath sounds checked- equal and bilateral Secured at: 23 cm Tube secured with: Tape Dental Injury: Teeth and Oropharynx as per pre-operative assessment

## 2013-10-09 NOTE — H&P (View-Only) (Signed)
Patient ID: Harry Hall, male   DOB: Sep 26, 1950, 63 y.o.   MRN: [id number] Patient with displaced posterior facet 2 part fracture. Plan for surgical intervention tomorrow morning with open reduction internal fixation of the left calcaneus fracture. N.p.o. after midnight. Patient will need to sign consent.

## 2013-10-09 NOTE — Op Note (Signed)
10/07/2013 - 10/09/2013  10:10 AM  PATIENT:  Harry Hall    PRE-OPERATIVE DIAGNOSIS:  Left Calcaneus Fracture  POST-OPERATIVE DIAGNOSIS:  Same  PROCEDURE:  Open Reduction Internal Fixation Left Calcaneus Fracture  SURGEON:  [name], MD  PHYSICIAN ASSISTANT:None ANESTHESIA:   General  PREOPERATIVE INDICATIONS:  Harry Hall is a  63 y.o. male with a diagnosis of Left Calcaneus Fracture who failed conservative measures and elected for surgical management.    The risks benefits and alternatives were discussed with the patient preoperatively including but not limited to the risks of infection, bleeding, nerve injury, cardiopulmonary complications, the need for revision surgery, among others, and the patient was willing to proceed.  OPERATIVE IMPLANTS: Synthes locking calcaneal plate  OPERATIVE FINDINGS: Significant comminution  OPERATIVE PROCEDURE: Patient was brought to the operating room and underwent a general anesthetic. After adequate levels of anesthesia were obtained patient was placed in the right lateral decubitus position with the left side up with the left lower extremity prepped using DuraPrep draped into a sterile field. An extensile lateral incision was made over the calcaneus. The skin was covered with Ioban. Sharp dissection was carried down to the calcaneus and subperiosteal dissection was used to elevate a large flap. Care was taken not to touch the skin. 2 K wires were placed in the talus and the fibula to elevate the flap. The lateral wall fragment was elevated the wound was cleansed. A threaded Schanz pin was placed in the calcaneus and the calcaneus was pulled out to length. The lateral wall and posterior facet fragment was then reduced and stabilized the K wire. The lateral locking plate was applied with compression screws and locking screws were placed to hold the lateral facet piece elevated and intact. C-arm fluoroscopy in both the axial and lateral  views showed stable alignment. The wound was irrigated with normal saline. Subcutaneous is closed using 2-0 nylon. An Algower Donati suture technique was used to close the skin. A sterile bulky dressing was applied with compression with Coban. Patient was extubated taken to the PACU in stable condition.

## 2013-10-09 NOTE — Progress Notes (Signed)
NURSING PROGRESS NOTE  Harry Hall [id number] Admission Data: 10/09/2013 2:28 AM Attending Provider: Meredith Pel, MD CHY:IFOYDXA, Drue Stager., MD Code Status: full code  Harry Hall is a 63 y.o. male patient admitted from ED:  -No acute distress noted.  -No complaints of shortness of breath.  -No complaints of chest pain.   Cardiac Monitoring: Box # N/A in place. Cardiac monitor yields:N/A.  Blood pressure 119/70, pulse 79, temperature 97.3 F (36.3 C), temperature source Oral, resp. rate 16, height 5\' 8"  (1.727 m), weight 112.7 kg (248 lb 7.3 oz), SpO2 95.00%.   IV Fluids:  IV in place, occlusive dsg intact without redness, IV cath LT Hand none.   Allergies:  Review of patient's allergies indicates no known allergies.  Past Medical History:   has a past medical history of Hyperlipidemia.  Past Surgical History:   has past surgical history that includes Tonsillectomy and Cataract extraction (03/17/2010).  Social History:   reports that he has quit smoking. He has never used smokeless tobacco. He reports that he drinks about 8.4 ounces of alcohol per week. He reports that he does not use illicit drugs.  Skin: NSI  Patient/Family orientated to room. Information packet given to patient/family. Admission inpatient armband information verified with patient/family to include name and date of birth and placed on patient arm. Side rails up x 2, fall assessment and education completed with patient/family. Patient/family able to verbalize understanding of risk associated with falls and verbalized understanding to call for assistance before getting out of bed. Call light within reach. Patient/family able to voice and demonstrate understanding of unit orientation instructions.    Will continue to evaluate and treat per MD orders.

## 2013-10-09 NOTE — Anesthesia Preprocedure Evaluation (Addendum)
Anesthesia Evaluation  Patient identified by MRN, date of birth, ID band Patient awake    Reviewed: Allergy & Precautions, H&P , NPO status , Patient's Chart, lab work & pertinent test results  Airway Mallampati: III TM Distance: >3 FB Neck ROM: Full    Dental no notable dental hx. (+) Teeth Intact, Dental Advisory Given   Pulmonary neg pulmonary ROS, former smoker,  breath sounds clear to auscultation  Pulmonary exam normal       Cardiovascular negative cardio ROS  Rhythm:Regular Rate:Normal     Neuro/Psych negative neurological ROS  negative psych ROS   GI/Hepatic negative GI ROS, Neg liver ROS,   Endo/Other  negative endocrine ROS  Renal/GU negative Renal ROS  negative genitourinary   Musculoskeletal   Abdominal   Peds  Hematology negative hematology ROS (+)   Anesthesia Other Findings   Reproductive/Obstetrics negative OB ROS                          Anesthesia Physical Anesthesia Plan  ASA: II  Anesthesia Plan: General and Regional   Post-op Pain Management:    Induction: Intravenous  Airway Management Planned: Oral ETT and LMA  Additional Equipment:   Intra-op Plan:   Post-operative Plan: Extubation in OR  Informed Consent: I have reviewed the patients History and Physical, chart, labs and discussed the procedure including the risks, benefits and alternatives for the proposed anesthesia with the patient or authorized representative who has indicated his/her understanding and acceptance.   Dental advisory given  Plan Discussed with: CRNA  Anesthesia Plan Comments:         Anesthesia Quick Evaluation

## 2013-10-09 NOTE — Transfer of Care (Signed)
Immediate Anesthesia Transfer of Care Note  Patient: Harry Hall  Procedure(s) Performed: Procedure(s) with comments: Open Reduction Internal Fixation Left Calcaneus Fracture (Left) - Open Reduction Internal Fixation Left Calcaneus Fracture  Patient Location: PACU  Anesthesia Type:General  Level of Consciousness: awake, alert , oriented and patient cooperative  Airway & Oxygen Therapy: Patient Spontanous Breathing and Patient connected to face mask oxygen  Post-op Assessment: Report given to PACU RN, Post -op Vital signs reviewed and stable and Patient moving all extremities  Post vital signs: Reviewed and stable  Complications: No apparent anesthesia complications

## 2013-10-09 NOTE — Progress Notes (Signed)
Orthopedic Tech Progress Note Patient Details:  Harry Hall Mar 08, 1951 [id number]  Ortho Devices Type of Ortho Device: Post (short leg) splint Ortho Device/Splint Interventions: Application   Theodoro Parma Cammer 10/09/2013, 1:06 PM

## 2013-10-10 NOTE — Progress Notes (Signed)
Patient ID: Harry Hall, male   DOB: Jan 11, 1951, 63 y.o.   MRN: [id number] Postoperative day 1 open reduction internal fixation left calcaneus fracture. Review of these MRI scan of the right shoulder shows significant pathology including the subscapularis a Bankart lesion and rotator cuff tear. Anticipate patient may require surgical intervention for his shoulder. Do to the inability to weight-bear through the right upper extremity and left lower extremity patient will need discharge to skilled nursing. Physical therapy orders for transfer training.

## 2013-10-10 NOTE — Progress Notes (Signed)
Patient ID: Harry Hall, male   DOB: [birth year], 63 y.o.   MRN: [id number] Postoperative day 1 status post open reduction internal fixation left calcaneus fracture. Review of the MRI scan of the right shoulder shows significant pathology including the rotator cuff, subscapularis and a bony Bankart fracture. It would definitely be difficult for patient to mobilize through his right upper extremity to maintain nonweightbearing for the left foot. Will proceed with plans for skilled nursing placement until patient is able to weight-bear through the left foot.

## 2013-10-11 MED ORDER — ENOXAPARIN SODIUM 40 MG/0.4ML ~~LOC~~ SOLN
40.0000 mg | Freq: Every day | SUBCUTANEOUS | Status: DC
  Administered 2013-10-11: 40 mg via SUBCUTANEOUS
  Filled 2013-10-11 (×2): qty 0.4

## 2013-10-11 NOTE — Progress Notes (Signed)
Pt stable no or for shoulder this week - pt needs to progress with wb on ankle first Ready for snf transfer

## 2013-10-11 NOTE — Progress Notes (Signed)
Occupational Therapy Treatment Patient Details Name: Harry Hall MRN: [id number] DOB: 1951/04/05 Today's Date: 10/11/2013    History of present illness Pt. was admitted 10/07/13 following a 20 foot fall from a ladder after ladder broke.  Pt. sustained  a R shoulder dislocation,  and a Left calcaneus fx.  S/p ORIF calcaneous  10/09/13.Marland Kitchen Shoulder dislocation reduced in the ED.   NWB RUE> No shoulder surgery planned this admit until ankle more stable per Dr. Marlou Sa.   OT comments  Pt presents with the above and is progressing well towards goals. The focus of this treatment was on teaching modified pendulum exercises while seated in the recliner/EOB and to go over d/c plans. Pt feels confident that he can go home with wife to assist when needed. Pt needs a 3in1, hospital bed, w/c, tub bench and home health PT to assist in his recovery. The case manager was consulted about his equipment. D/c plan is now home and there is no further need for OT, we will sign off.  Follow Up Recommendations  No OT follow up    Equipment Recommendations  3 in 1 bedside comode;Tub/shower bench;Wheelchair (measurements OT);Wheelchair cushion (measurements OT);Hospital bed (all equipment was told to the case manager)       Precautions / Restrictions Precautions Precautions: Fall Restrictions Weight Bearing Restrictions: Yes RUE Weight Bearing: Non weight bearing LLE Weight Bearing: Non weight bearing Other Position/Activity Restrictions: NWB with RUE and no pushing, pulling or lifting.       Mobility  Transfers Overall transfer level: Needs assistance Equipment used:  (wheelchair)/ recliner Transfers: Stand Pivot Transfers;Squat Pivot Transfers Stand pivot transfers: Modified independent (Device/Increase time) Squat pivot transfers: Modified independent (Device/Increase time)     General transfer comment: Pt able to do squat pivot transfer adhering to NWB precautions in RUE and LLE with increased time    Balance   Sitting-balance support: No upper extremity supported;Feet supported Sitting balance-Leahy Scale: Good     Standing balance support: Single extremity supported Standing balance-Leahy Scale: Fair                      ADL Overall ADL's : Needs assistance/impaired       Toilet Transfer: BSC;Stand-pivot;Modified Independent;Squat-pivot Toilet Transfer Details (indicate cue type and reason): simulated toilet transfer from recliner to bed Toileting- Clothing Manipulation and Hygiene: Modified independent Toileting - Clothing Manipulation Details (indicate cue type and reason): sit to partial stand                      Cognition   Behavior During Therapy: [facility] for tasks assessed/performed Overall Cognitive Status: Within Functional Limits for tasks assessed                         Exercises Other Exercises Other Exercises: Modified Pendulum exercises seated in recliner and at EOB. Pt was educated in 5 sets of 10 throughout the day and was given a handout with exercises on it.           Pertinent Vitals/ Pain       No c/o pain.            Progress Toward Goals  OT Goals(current goals can now be found in the care plan section)  Progress towards OT goals: Progressing toward goals  Acute Rehab OT Goals Patient Stated Goal: home for recovery OT Goal Formulation: With patient/family Time For Goal Achievement: 10/18/13 Potential to Achieve Goals:  Good  Plan Discharge plan remains appropriate          Activity Tolerance Patient tolerated treatment well   Patient Left in chair;with call bell/phone within reach;with family/visitor present   Nurse Communication          Time: 5400-8676 OT Time Calculation (min): 29 min  Charges: OT General Charges $OT Visit: 1 Procedure OT Treatments $Self Care/Home Management : 8-22 mins $Therapeutic Exercise: 8-22 mins  Lyda Perone 10/11/2013, 4:15 PM

## 2013-10-11 NOTE — Clinical Social Work Psychosocial (Signed)
Clinical Social Work Department BRIEF PSYCHOSOCIAL ASSESSMENT 10/11/2013  Patient:  Harry Hall, Harry Hall     Account Number:  [id number]     Admit date:  10/07/2013  Clinical Social Worker:  Lovey Newcomer  Date/Time:  10/11/2013 11:15 AM  Referred by:  Physician  Date Referred:  10/11/2013 Referred for  SNF Placement   Other Referral:   Interview type:  Patient Other interview type:   Patient and wife interviewed at bedside. Per MD patient will require SNF placement.    PSYCHOSOCIAL DATA Living Status:  WIFE Admitted from facility:   Level of care:   Primary support name:  Harry Hall Primary support relationship to patient:  SPOUSE Degree of support available:   Support is good.    CURRENT CONCERNS Current Concerns  Post-Acute Placement   Other Concerns:    SOCIAL WORK ASSESSMENT / PLAN CSW met with patient at bedside to complete assessment. Patient is agreeable to SNF placement as MD has recommended this. CSW explained the SNF search/placement process and answered questions. Patient would prefer placement in [place] if possible. CSW explained that patient will need to go to a facility that will accept payment from his worker's comp. Patient verbalized understanding. Patient has a preference for [facility]. Patient is from home with wife and is optimistic that he will be able to return home with wife after rehab.      Update 2:30PM: Per RNCM patient will DC with HHPT at discharge.   Assessment/plan status:  Psychosocial Support/Ongoing Assessment of Needs Other assessment/ plan:   Complete FL2, Fax, PASRR   Information/referral to community resources:   CSW contact information and SNF list given to patient and wife.    PATIENT'S/FAMILY'S RESPONSE TO PLAN OF CARE: Patient is agreeable to DC to SNF if necessary. Patient would like facility in [facility]. Patient was pleasant, appropriate, and appreciative of CSW visit. CSW will assist with DC if necessary.        Liz Beach MSW, [place], [place], [phone]

## 2013-10-11 NOTE — Progress Notes (Signed)
PHYSICAL THERAPY NOTE-  Spoke with OT who reports pt and wife want  Patient to DC home. No need for CIR screen. PT agrees with plan. Patient will need WC and cushion and HHPT. Tresa Endo PT [phone]

## 2013-10-11 NOTE — Progress Notes (Signed)
Physical Therapy Treatment Patient Details Name: Harry Hall MRN: [id number] DOB: 1950/07/21 Today's Date: 10/11/2013    History of Present Illness Pt. was admitted 10/07/13 following a 20 foot fall from a ladder after ladder broke.  Pt. sustained  a R shoulder dislocation, now in sling and a Left calcaneus fx.  S/p ORIIF calcaneous  10/09/13.Marland Kitchen Shoulder dislocation reduced in the ED.  P.T. ordered for NWB L LE. NWB RUE> No shoulder surgery planned this admit per Dr. Marlou Sa.    PT Comments    Pt has had ORIF L calcaneus  fx on 10/10/13. Pt's functional status has not changed so current goals remain appropriate. Pt will need post acute rehab prior to DC home. Frequent cues to ensure pt does not use RUE during transfers.  Follow Up Recommendations   (if not CIR, recommned SNF.)     Equipment Recommendations  Wheelchair (measurements PT)    Recommendations for Other Services Rehab consult     Precautions / Restrictions Precautions Precautions: Fall Required Braces or Orthoses: Sling;Other Brace/Splint Other Brace/Splint: post op shoe on R Restrictions Weight Bearing Restrictions: Yes RUE Weight Bearing: Non weight bearing LLE Weight Bearing: Non weight bearing    Mobility  Bed Mobility                  Transfers   Equipment used:  (wheelchair) Transfers: Squat Pivot Transfers;Stand Pivot Transfers Sit to Stand: Min assist Stand pivot transfers: Min assist Squat pivot transfers: Min assist     General transfer comment: cues for safety and not to use R UE. Pt declined to place sling.  pt able to squat and stand pivot from recliner to [facility] to recliner with min assist.  Ambulation/Gait                 Hotel manager mobility: Yes Wheelchair propulsion: Left upper extremity;Right lower extremity Wheelchair parts: Needs assistance Distance: 200' Wheelchair Assistance Details (indicate cue type and  reason): frequent rest breaks, at times min assist for propelling  WC, cues for turning and for [facility] setup/safety for transfer. Pt. is impulsive.  Modified Rankin (Stroke Patients Only)       Balance   Sitting-balance support: No upper extremity supported;Feet supported Sitting balance-Leahy Scale: Good     Standing balance support: Single extremity supported Standing balance-Leahy Scale: Poor                      Cognition Arousal/Alertness: Awake/alert Behavior During Therapy: Impulsive                        Exercises      General Comments        Pertinent Vitals/Pain Reports no pain.    Home Living                      Prior Function            PT Goals (current goals can now be found in the care plan section)      Frequency  Min 5X/week    PT Plan Discharge plan needs to be updated    Co-evaluation             End of Session   Activity Tolerance: Patient tolerated treatment well Patient left: in chair;with call bell/phone within reach;with family/visitor present  Time: 2993-7169 PT Time Calculation (min): 23 min  Charges:  $Wheel Chair Management: 23-37 mins                    G Codes:      Claretha Cooper 10/11/2013, 1:58 PM Tresa Endo PT [phone]

## 2013-10-11 NOTE — Clinical Social Work Placement (Signed)
Clinical Social Work Department CLINICAL SOCIAL WORK PLACEMENT NOTE 10/11/2013  Patient:  Harry Hall, Harry Hall  Account Number:  [id number] Admit date:  10/07/2013  Clinical Social Worker:  Lovey Newcomer  Date/time:  10/11/2013 11:15 AM  Clinical Social Work is seeking post-discharge placement for this patient at the following level of care:   SKILLED NURSING   (*CSW will update this form in Epic as items are completed)   10/11/2013  Patient/family provided with [facility] Department of Clinical Social Work's list of facilities offering this level of care within the geographic area requested by the patient (or if unable, by the patient's family).  10/11/2013  Patient/family informed of their freedom to choose among providers that offer the needed level of care, that participate in Medicare, Medicaid or managed care program needed by the patient, have an available bed and are willing to accept the patient.  10/11/2013  Patient/family informed of [facility]' ownership interest in [facility], as well as of the fact that they are under no obligation to receive care at this facility.  PASARR submitted to EDS on 10/11/2013 PASARR number received from EDS on 10/11/2013  FL2 transmitted to all facilities in geographic area requested by pt/family on  10/11/2013 FL2 transmitted to all facilities within larger geographic area on   Patient informed that his/her managed care company has contracts with or will negotiate with  certain facilities, including the following:     Patient/family informed of bed offers received:  10/11/2013 Patient chooses bed at [facility] Physician recommends and patient chooses bed at    Patient to be transferred to  on   Patient to be transferred to facility by   The following physician request were entered in Epic:   Additional Comments:    Liz Beach MSW, [employer], [place], [phone]

## 2013-10-11 NOTE — Progress Notes (Signed)
Patient ID: Harry Hall, male   DOB: Aug 27, 1950, 63 y.o.   MRN: [id number] Plan for skilled nursing placement. Dr. Marlou Sa to evaluate patient for possible surgical intervention for the right shoulder. Physical therapy for transfer training.

## 2013-10-11 NOTE — Clinical Social Work Note (Signed)
Patient and wife given bed offers. Waiting to her from worker's comp CM.  Liz Beach MSW, [place], [place], [phone]

## 2013-10-11 NOTE — Progress Notes (Signed)
Received pre-screen request for possible inpatient rehab but noted that both PT/OT are now recommending home with family assistance and needed equipment. No need for CIR screen.  Thanks.  Nanetta Batty, PT Rehabilitation Admissions Coordinator [phone]

## 2013-10-12 MED ORDER — OXYCODONE-ACETAMINOPHEN 5-325 MG PO TABS: 1.0000 | ORAL_TABLET | ORAL | Status: DC | PRN

## 2013-10-12 NOTE — Progress Notes (Signed)
PHYSICAL THERAPY NOTE-Spoke to wife, pt asleep. Discussed equipment needs, getting into home with small "lip"  At door, wife feels she can get pt into home via [place]. Also discussed that pt may want to sit in back seat cross ways. Also  Brought up need for nonemergent EMS if wife had concerns. . Recommended she let RN know if  Needed. Pt will have to have all DME at home prior to DC. Recommend HHPT for safety as pt will  be home alone once wife returns to work. Will check back later. Tresa Endo PT [phone]

## 2013-10-12 NOTE — Discharge Summary (Signed)
Physician Discharge Summary  Patient ID: Harry Hall MRN: [id number] DOB/AGE: 1950/09/14 63 y.o.  Admit date: 10/07/2013 Discharge date: 10/12/2013  Admission Diagnoses: Right shoulder dislocation and left calcaneus fracture  Discharge Diagnoses: Same Active Problems:   Calcaneus fracture, left   Discharged Condition: stable  Hospital Course: Patient's hospital course was essentially unremarkable. He underwent open reduction internal fixation of the left calcaneus fracture and closed reduction of the right shoulder dislocation. Postoperatively patient progressed well and was discharged to home in stable condition.  Consults: None  Significant Diagnostic Studies: labs: Routine labs  Treatments: surgery: See operative note  Discharge Exam: Blood pressure 116/66, pulse 59, temperature 98.8 F (37.1 C), temperature source Oral, resp. rate 18, height 5\' 8"  (1.727 m), weight 112.7 kg (248 lb 7.3 oz), SpO2 97.00%. Incision/Wound: dressing clean dry and intact  Disposition: Final discharge disposition not confirmed  Discharge Instructions   Call MD / Call 911    Complete by:  As directed   If you experience chest pain or shortness of breath, CALL 911 and be transported to the hospital emergency room.  If you develope a fever above 101 F, pus (white drainage) or increased drainage or redness at the wound, or calf pain, call your surgeon's office.     Constipation Prevention    Complete by:  As directed   Drink plenty of fluids.  Prune juice may be helpful.  You may use a stool softener, such as Colace (over the counter) 100 mg twice a day.  Use MiraLax (over the counter) for constipation as needed.     Diet - low sodium heart healthy    Complete by:  As directed      Increase activity slowly as tolerated    Complete by:  As directed             Medication List         aspirin 81 MG tablet  Take 81 mg by mouth daily.     atorvastatin 80 MG tablet  Commonly known as:   LIPITOR  Take 80 mg by mouth daily.     cetirizine 10 MG tablet  Commonly known as:  ZYRTEC  Take 10 mg by mouth daily.     fish oil-omega-3 fatty acids 1000 MG capsule  Take 1 g by mouth daily.     ibuprofen 200 MG tablet  Commonly known as:  ADVIL,MOTRIN  Take 600 mg by mouth every 6 (six) hours as needed for headache.     oxyCODONE-acetaminophen 5-325 MG per tablet  Commonly known as:  ROXICET  Take 1 tablet by mouth every 4 (four) hours as needed for severe pain.           Follow-up Information   Follow up with DUDA,MARCUS V, MD In 2 weeks.   Specialty:  Orthopedic Surgery   Contact information:   [place] [address] [phone]       Follow up with Meredith Pel, MD In 2 weeks.   Specialty:  Orthopedic Surgery   Contact information:   [place] [address] [phone]       Signed: [name] 10/12/2013, 6:42 AM

## 2013-10-12 NOTE — Progress Notes (Signed)
PT DC WITH WIFE AT THIS TIME, BOTH PT AND WIFE VERBALLY UNDERSTOOD DC INSTRUCTIONS, WIFE STATED THAT EQUIPMENT INCLUDING WC WOULD BE WAITING ON THEM AT HOME, WIFE ALSO STATED THAT HH HAS BEEN  ARRANGED AS WELL. SHE COULD NOT TELL ME THE NAME OF THE AGENCY.

## 2013-10-13 ENCOUNTER — Encounter (HOSPITAL_COMMUNITY): Payer: Self-pay | Admitting: Orthopedic Surgery

## 2013-10-15 ENCOUNTER — Encounter (HOSPITAL_COMMUNITY): Payer: Self-pay | Admitting: Orthopedic Surgery

## 2013-10-21 NOTE — Progress Notes (Signed)
I have read and agree with this note. Late Co-sign for 10/07/2013   [address], OTR/L [phone]

## 2014-05-23 ENCOUNTER — Ambulatory Visit (INDEPENDENT_AMBULATORY_CARE_PROVIDER_SITE_OTHER): Payer: Self-pay | Admitting: Surgery

## 2014-05-23 NOTE — H&P (Signed)
History of Present Illness Harry Hall. Harry Baumgarten MD; 05/23/2014 1:27 PM) Patient words: hernia.  The patient is a 64 year old male who presents with an umbilical hernia. Referred by Dr. Molli Hall for evaluation of umbilical hernia. The patient was evaluated in 2012 for an enlarging umbilical hernia. At that time he chose not to have it repaired as a financial reasons. Over the last 3-1/2 years it has become noticeably larger. It occasionally causes some significant discomfort. He denies any obstructive symptoms. He continues to have regular bowel movements. The patient had an accident at work and had a severe injury to his ankle. He is currently out of work and has had excessive ankle surgery over the last year. He would like to have the hernia repaired while he is out of work. Unfortunately, while he has been out of work he has put on about 30 pounds of weight. Other Problems Harry Hall, CMA; 05/23/2014 10:30 AM) Hypercholesterolemia  Past Surgical History Harry Hall, CMA; 05/23/2014 10:30 AM) Foot Surgery Left.  Diagnostic Studies History Harry Hall, CMA; 05/23/2014 10:30 AM) Colonoscopy 5-10 years ago  Allergies Harry Hall, [place]; 05/23/2014 10:31 AM) No Known Drug Allergies 05/23/2014  Medication History Harry Hall, CMA; 05/23/2014 10:31 AM) Harry Hall Aspirin (81MG  Tablet Chewable, Oral) Active. ZyrTEC Allergy (10MG  Tablet, Oral) Active. Fish Oil Active.  Social History Harry Hall, CMA; 05/23/2014 10:30 AM) Alcohol use Moderate alcohol use. Caffeine use Carbonated beverages. No drug use Tobacco use Former smoker.  Family History Harry Hall, [place]; 05/23/2014 10:30 AM) Malignant Neoplasm Of Pancreas Father.     Review of Systems Harry Hall CMA; 05/23/2014 10:30 AM) General Present- Weight Gain. Not Present- Appetite Loss, Chills, Fatigue, Fever, Night Sweats and Weight Loss. Skin Not Present- Change in Wart/Mole, Dryness,  Hives, Jaundice, New Lesions, Non-Healing Wounds, Rash and Ulcer. HEENT Not Present- Earache, Hearing Loss, Hoarseness, Nose Bleed, Oral Ulcers, Ringing in the Ears, Seasonal Allergies, Sinus Pain, Sore Throat, Visual Disturbances, Wears glasses/contact lenses and Yellow Eyes. Respiratory Not Present- Bloody sputum, Chronic Cough, Difficulty Breathing, Snoring and Wheezing. Breast Not Present- Breast Mass, Breast Pain, Nipple Discharge and Skin Changes. Cardiovascular Not Present- Chest Pain, Difficulty Breathing Lying Down, Leg Cramps, Palpitations, Rapid Heart Rate, Shortness of Breath and Swelling of Extremities. Gastrointestinal Not Present- Abdominal Pain, Bloating, Bloody Stool, Change in Bowel Habits, Chronic diarrhea, Constipation, Difficulty Swallowing, Excessive gas, Gets full quickly at meals, Hemorrhoids, Indigestion, Nausea, Rectal Pain and Vomiting. Male Genitourinary Not Present- Blood in Urine, Change in Urinary Stream, Frequency, Impotence, Nocturia, Painful Urination, Urgency and Urine Leakage. Musculoskeletal Not Present- Back Pain, Joint Pain, Joint Stiffness, Muscle Pain, Muscle Weakness and Swelling of Extremities. Neurological Not Present- Decreased Memory, Fainting, Headaches, Numbness, Seizures, Tingling, Tremor, Trouble walking and Weakness. Psychiatric Not Present- Anxiety, Bipolar, Change in Sleep Pattern, Depression, Fearful and Frequent crying. Endocrine Not Present- Cold Intolerance, Excessive Hunger, Hair Changes, Heat Intolerance, Hot flashes and New Diabetes. Hematology Not Present- Easy Bruising, Excessive bleeding, Gland problems, HIV and Persistent Infections.  Vitals Harry Hall CMA; 05/23/2014 10:32 AM) 05/23/2014 10:31 AM Weight: 275 lb Height: 68in Body Surface Area: 2.45 m Body Mass Index: 41.81 kg/m Temp.: 98.78F  Pulse: 73 (Regular)  BP: 140/88 (Sitting, Left Arm, Standard)     Physical Exam Harry Key K. Arrian Manson MD; 05/23/2014 1:28  PM)  The physical exam findings are as follows: Note:WDWN in NAD HEENT: EOMI, sclera anicteric Neck: No masses, no thyromegaly Lungs: CTA bilaterally; normal respiratory effort CV: Regular rate and rhythm; no murmurs  Abd: +bowel sounds, soft, obese; large upper midline rectus diastasis; large umbilical hernia - sac measuring about 10 cm across; partially reducible; no skin discoloration Ext: Well-perfused; no edema Skin: Warm, dry; no sign of jaundice    Assessment & Plan Harry Key K. Olivia Pavelko MD; 0/05/7492 49:67 AM)  UMBILICAL HERNIA, INCARCERATED (552.1  K42.0)  Current Plans Schedule for Surgery - Umbilical hernia repair with mesh. The surgical procedure has been discussed with the patient. Potential risks, benefits, alternative treatments, and expected outcomes have been explained. All of the patient's questions at this time have been answered. The likelihood of reaching the patient's treatment goal is good. The patient understand the proposed surgical procedure and wishes to proceed.  Harry Hall. Harry Dover, MD, [facility]  General/ Trauma Surgery  05/23/2014 1:29 PM

## 2014-06-13 NOTE — Pre-Procedure Instructions (Signed)
DUAINE RADIN  06/13/2014   Your procedure is scheduled on:  Wed, Feb 10 @ 11:00 AM  Report to [facility] at 9:00 AM.  Call this number if you have problems the morning of surgery: [phone]   Remember:   Do not eat food or drink liquids after midnight.   Take these medicines the morning of surgery with A SIP OF WATER: Zyrtec(Cetirizine) and Pain Pill(if needed)              Stop taking your Ibuprofen,Fish Oil,and Aspirin. No Goody's,BC's,Aleve,or any Herbal Medications   Do not wear jewelry  Do not wear lotions, powders, or colognes. You may wear deodorant.  Men may shave face and neck.  Do not bring valuables to the hospital.  [facility] is not responsible                  for any belongings or valuables.               Contacts, dentures or bridgework may not be worn into surgery.  Leave suitcase in the car. After surgery it may be brought to your room.  For patients admitted to the hospital, discharge time is determined by your                treatment team.               Patients discharged the day of surgery will not be allowed to drive  home.    Special Instructions:  [place] - Preparing for Surgery  Before surgery, you can play an important role.  Because skin is not sterile, your skin needs to be as free of germs as possible.  You can reduce the number of germs on you skin by washing with CHG (chlorahexidine gluconate) soap before surgery.  CHG is an antiseptic cleaner which kills germs and bonds with the skin to continue killing germs even after washing.  Please DO NOT use if you have an allergy to CHG or antibacterial soaps.  If your skin becomes reddened/irritated stop using the CHG and inform your nurse when you arrive at Short Stay.  Do not shave (including legs and underarms) for at least 48 hours prior to the first CHG shower.  You may shave your face.  Please follow these instructions carefully:   1.  Shower with CHG Soap the night before  surgery and the                                morning of Surgery.  2.  If you choose to wash your hair, wash your hair first as usual with your       normal shampoo.  3.  After you shampoo, rinse your hair and body thoroughly to remove the                      Shampoo.  4.  Use CHG as you would any other liquid soap.  You can apply chg directly       to the skin and wash gently with scrungie or a clean washcloth.  5.  Apply the CHG Soap to your body ONLY FROM THE NECK DOWN.        Do not use on open wounds or open sores.  Avoid contact with your eyes,       ears, mouth and genitals (private parts).  Wash genitals (private parts)       with your normal soap.  6.  Wash thoroughly, paying special attention to the area where your surgery        will be performed.  7.  Thoroughly rinse your body with warm water from the neck down.  8.  DO NOT shower/wash with your normal soap after using and rinsing off       the CHG Soap.  9.  Pat yourself dry with a clean towel.            10.  Wear clean pajamas.            11.  Place clean sheets on your bed the night of your first shower and do not        sleep with pets.  Day of Surgery  Do not apply any lotions/deoderants the morning of surgery.  Please wear clean clothes to the hospital/surgery center.     Please read over the following fact sheets that you were given: Pain Booklet, Coughing and Deep Breathing and Surgical Site Infection Prevention

## 2014-06-14 ENCOUNTER — Encounter (HOSPITAL_COMMUNITY): Payer: Self-pay

## 2014-06-14 ENCOUNTER — Encounter (HOSPITAL_COMMUNITY)
Admission: RE | Admit: 2014-06-14 | Discharge: 2014-06-14 | Disposition: A | Discharge status: Home / Self Care | Payer: 59 | Source: Ambulatory Visit | Attending: Surgery | Admitting: Surgery

## 2014-06-14 DIAGNOSIS — K429 Umbilical hernia without obstruction or gangrene: Secondary | ICD-10-CM | POA: Insufficient documentation

## 2014-06-14 DIAGNOSIS — Z01812 Encounter for preprocedural laboratory examination: Secondary | ICD-10-CM | POA: Insufficient documentation

## 2014-06-14 HISTORY — DX: Personal history of other diseases of the musculoskeletal system and connective tissue: Z87.39

## 2014-06-14 HISTORY — DX: Personal history of other diseases of the digestive system: Z87.19

## 2014-06-14 HISTORY — DX: Unspecified rotator cuff tear or rupture of unspecified shoulder, not specified as traumatic: M75.100

## 2014-06-14 HISTORY — DX: Gastro-esophageal reflux disease without esophagitis: K21.9

## 2014-06-14 HISTORY — DX: Reserved for inherently not codable concepts without codable children: IMO0001

## 2014-06-14 HISTORY — DX: Pain in unspecified joint: M25.50

## 2014-06-14 HISTORY — DX: Sleep apnea, unspecified: G47.30

## 2014-06-14 LAB — BASIC METABOLIC PANEL
ANION GAP: 7 (ref 5–15)
BUN: 11 mg/dL (ref 6–23)
CO2: 26 mmol/L (ref 19–32)
Calcium: 9.4 mg/dL (ref 8.4–10.5)
Chloride: 107 mmol/L (ref 96–112)
Creatinine, Ser: 0.9 mg/dL (ref 0.50–1.35)
GFR calc Af Amer: >90 mL/min (ref >90)
GFR calc non Af Amer: >90 mL/min (ref >90)
Glucose, Bld: 105 mg/dL — ABNORMAL HIGH (ref 70–99)
Potassium: 4 mmol/L (ref 3.5–5.1)
SODIUM: 140 mmol/L (ref 135–145)

## 2014-06-14 LAB — CBC
HCT: 45.8 % (ref 39.0–52.0)
Hemoglobin: 16.3 g/dL (ref 13.0–17.0)
MCH: 32.8 pg (ref 26.0–34.0)
MCHC: 35.6 g/dL (ref 30.0–36.0)
MCV: 92.2 fL (ref 78.0–100.0)
PLATELETS: 211 10*3/uL (ref 150–400)
RBC: 4.97 MIL/uL (ref 4.22–5.81)
RDW: 13 % (ref 11.5–15.5)
WBC: 6.4 10*3/uL (ref 4.0–10.5)

## 2014-06-14 MED ORDER — CHLORHEXIDINE GLUCONATE 4 % EX LIQD: 1.0000 "application " | Freq: Once | CUTANEOUS | Status: DC

## 2014-06-14 NOTE — Progress Notes (Signed)
Pt doesn't have a cardiologist  Denies ever having an echo/stress test/heart cath  Medical Md is Dr.Gaelen Tammi Klippel  Denies EKG or CXR in past yr

## 2014-06-21 MED ORDER — DEXTROSE 5 % IV SOLN
3.0000 g | INTRAVENOUS | Status: AC
  Administered 2014-06-22: 3 g via INTRAVENOUS
  Filled 2014-06-21: qty 3000

## 2014-06-22 ENCOUNTER — Ambulatory Visit (HOSPITAL_COMMUNITY): Payer: 59 | Admitting: Anesthesiology

## 2014-06-22 ENCOUNTER — Encounter (HOSPITAL_COMMUNITY)
Admission: RE | Disposition: A | Discharge status: Home / Self Care | Payer: Self-pay | Source: Ambulatory Visit | Attending: Surgery

## 2014-06-22 ENCOUNTER — Ambulatory Visit (HOSPITAL_COMMUNITY)
Admission: RE | Admit: 2014-06-22 | Discharge: 2014-06-22 | Disposition: A | Discharge status: Home / Self Care | Payer: 59 | Source: Ambulatory Visit | Attending: Surgery | Admitting: Surgery

## 2014-06-22 ENCOUNTER — Encounter (HOSPITAL_COMMUNITY): Payer: Self-pay | Admitting: *Deleted

## 2014-06-22 DIAGNOSIS — Z6841 Body Mass Index (BMI) 40.0 and over, adult: Secondary | ICD-10-CM | POA: Insufficient documentation

## 2014-06-22 DIAGNOSIS — K219 Gastro-esophageal reflux disease without esophagitis: Secondary | ICD-10-CM | POA: Insufficient documentation

## 2014-06-22 DIAGNOSIS — G4730 Sleep apnea, unspecified: Secondary | ICD-10-CM | POA: Insufficient documentation

## 2014-06-22 DIAGNOSIS — E780 Pure hypercholesterolemia: Secondary | ICD-10-CM | POA: Insufficient documentation

## 2014-06-22 DIAGNOSIS — K429 Umbilical hernia without obstruction or gangrene: Secondary | ICD-10-CM | POA: Insufficient documentation

## 2014-06-22 DIAGNOSIS — Z87891 Personal history of nicotine dependence: Secondary | ICD-10-CM | POA: Insufficient documentation

## 2014-06-22 DIAGNOSIS — Z7982 Long term (current) use of aspirin: Secondary | ICD-10-CM | POA: Diagnosis not present

## 2014-06-22 HISTORY — PX: UMBILICAL HERNIA REPAIR: SHX196

## 2014-06-22 HISTORY — PX: INSERTION OF MESH: SHX5868

## 2014-06-22 SURGERY — REPAIR, HERNIA, UMBILICAL, ADULT
Anesthesia: General | Site: Abdomen

## 2014-06-22 MED ORDER — ONDANSETRON HCL 4 MG/2ML IJ SOLN: 4.0000 mg | INTRAMUSCULAR | Status: DC | PRN

## 2014-06-22 MED ORDER — 0.9 % SODIUM CHLORIDE (POUR BTL) OPTIME
TOPICAL | Status: DC | PRN
  Administered 2014-06-22: 1000 mL

## 2014-06-22 MED ORDER — OXYCODONE-ACETAMINOPHEN 5-325 MG PO TABS: 1.0000 | ORAL_TABLET | ORAL | Status: DC | PRN

## 2014-06-22 MED ORDER — MIDAZOLAM HCL 2 MG/2ML IJ SOLN
INTRAMUSCULAR | Status: AC
  Filled 2014-06-22: qty 2

## 2014-06-22 MED ORDER — FENTANYL CITRATE 0.05 MG/ML IJ SOLN
INTRAMUSCULAR | Status: AC
Start: 2014-06-22 — End: 2014-06-22
  Filled 2014-06-22: qty 5

## 2014-06-22 MED ORDER — NEOSTIGMINE METHYLSULFATE 10 MG/10ML IV SOLN
INTRAVENOUS | Status: DC | PRN
  Administered 2014-06-22: 4 mg via INTRAVENOUS

## 2014-06-22 MED ORDER — LIDOCAINE HCL (CARDIAC) 20 MG/ML IV SOLN
INTRAVENOUS | Status: AC
  Filled 2014-06-22: qty 5

## 2014-06-22 MED ORDER — SUCCINYLCHOLINE CHLORIDE 20 MG/ML IJ SOLN
INTRAMUSCULAR | Status: DC | PRN
  Administered 2014-06-22: 170 mg via INTRAVENOUS

## 2014-06-22 MED ORDER — ROCURONIUM BROMIDE 50 MG/5ML IV SOLN
INTRAVENOUS | Status: AC
  Filled 2014-06-22: qty 1

## 2014-06-22 MED ORDER — PROPOFOL 10 MG/ML IV BOLUS
INTRAVENOUS | Status: AC
  Filled 2014-06-22: qty 20

## 2014-06-22 MED ORDER — GLYCOPYRROLATE 0.2 MG/ML IJ SOLN
INTRAMUSCULAR | Status: DC | PRN
  Administered 2014-06-22: 0.6 mg via INTRAVENOUS

## 2014-06-22 MED ORDER — GLYCOPYRROLATE 0.2 MG/ML IJ SOLN
INTRAMUSCULAR | Status: AC
  Filled 2014-06-22: qty 3

## 2014-06-22 MED ORDER — LACTATED RINGERS IV SOLN
INTRAVENOUS | Status: DC | PRN
  Administered 2014-06-22: 11:00:00 via INTRAVENOUS

## 2014-06-22 MED ORDER — HYDROMORPHONE HCL 1 MG/ML IJ SOLN: 0.2500 mg | INTRAMUSCULAR | Status: DC | PRN

## 2014-06-22 MED ORDER — FENTANYL CITRATE 0.05 MG/ML IJ SOLN
INTRAMUSCULAR | Status: DC | PRN
  Administered 2014-06-22 (×2): 50 ug via INTRAVENOUS
  Administered 2014-06-22: 150 ug via INTRAVENOUS

## 2014-06-22 MED ORDER — LACTATED RINGERS IV SOLN
INTRAVENOUS | Status: DC
  Administered 2014-06-22: 10:00:00 via INTRAVENOUS

## 2014-06-22 MED ORDER — NEOSTIGMINE METHYLSULFATE 10 MG/10ML IV SOLN
INTRAVENOUS | Status: AC
  Filled 2014-06-22: qty 1

## 2014-06-22 MED ORDER — BUPIVACAINE-EPINEPHRINE 0.25% -1:200000 IJ SOLN
INTRAMUSCULAR | Status: DC | PRN
  Administered 2014-06-22: 20 mL

## 2014-06-22 MED ORDER — SUCCINYLCHOLINE CHLORIDE 20 MG/ML IJ SOLN
INTRAMUSCULAR | Status: AC
  Filled 2014-06-22: qty 1

## 2014-06-22 MED ORDER — MORPHINE SULFATE 2 MG/ML IJ SOLN: 2.0000 mg | INTRAMUSCULAR | Status: DC | PRN

## 2014-06-22 MED ORDER — PROPOFOL 10 MG/ML IV BOLUS
INTRAVENOUS | Status: DC | PRN
  Administered 2014-06-22: 150 mg via INTRAVENOUS

## 2014-06-22 MED ORDER — ROCURONIUM BROMIDE 100 MG/10ML IV SOLN
INTRAVENOUS | Status: DC | PRN
  Administered 2014-06-22: 10 mg via INTRAVENOUS
  Administered 2014-06-22: 30 mg via INTRAVENOUS

## 2014-06-22 MED ORDER — ONDANSETRON HCL 4 MG/2ML IJ SOLN
INTRAMUSCULAR | Status: DC | PRN
  Administered 2014-06-22: 4 mg via INTRAVENOUS

## 2014-06-22 SURGICAL SUPPLY — 55 items
APL SKNCLS STERI-STRIP NONHPOA (GAUZE/BANDAGES/DRESSINGS) ×1
BENZOIN TINCTURE PRP APPL 2/3 (GAUZE/BANDAGES/DRESSINGS) ×3 IMPLANT
BLADE SURG 10 STRL SS (BLADE) ×3 IMPLANT
BLADE SURG 15 STRL LF DISP TIS (BLADE) ×1 IMPLANT
BLADE SURG 15 STRL SS (BLADE) ×3
BLADE SURG ROTATE 9660 (MISCELLANEOUS) ×2 IMPLANT
CANISTER SUCTION 2500CC (MISCELLANEOUS) IMPLANT
CHLORAPREP W/TINT 26ML (MISCELLANEOUS) ×3 IMPLANT
CLOSURE WOUND 1/2 X4 (GAUZE/BANDAGES/DRESSINGS) ×1
COVER SURGICAL LIGHT HANDLE (MISCELLANEOUS) ×3 IMPLANT
DRAPE PED LAPAROTOMY (DRAPES) ×3 IMPLANT
DRAPE UTILITY XL STRL (DRAPES) ×3 IMPLANT
DRSG TEGADERM 2-3/8X2-3/4 SM (GAUZE/BANDAGES/DRESSINGS) ×1 IMPLANT
DRSG TEGADERM 4X4.75 (GAUZE/BANDAGES/DRESSINGS) ×2 IMPLANT
ELECT CAUTERY BLADE 6.4 (BLADE) ×3 IMPLANT
ELECT REM PT RETURN 9FT ADLT (ELECTROSURGICAL) ×3
ELECTRODE REM PT RTRN 9FT ADLT (ELECTROSURGICAL) ×1 IMPLANT
GAUZE SPONGE 2X2 8PLY STRL LF (GAUZE/BANDAGES/DRESSINGS) ×1 IMPLANT
GAUZE SPONGE 4X4 12PLY STRL (GAUZE/BANDAGES/DRESSINGS) ×3 IMPLANT
GAUZE SPONGE 4X4 16PLY XRAY LF (GAUZE/BANDAGES/DRESSINGS) ×3 IMPLANT
GLOVE BIO SURGEON STRL SZ7 (GLOVE) ×5 IMPLANT
GLOVE BIOGEL PI IND STRL 7.0 (GLOVE) IMPLANT
GLOVE BIOGEL PI IND STRL 7.5 (GLOVE) ×1 IMPLANT
GLOVE BIOGEL PI INDICATOR 7.0 (GLOVE) ×4
GLOVE BIOGEL PI INDICATOR 7.5 (GLOVE) ×2
GLOVE SURG SS PI 7.0 STRL IVOR (GLOVE) ×2 IMPLANT
GOWN STRL REUS W/ TWL LRG LVL3 (GOWN DISPOSABLE) ×2 IMPLANT
GOWN STRL REUS W/TWL LRG LVL3 (GOWN DISPOSABLE) ×9
KIT BASIN OR (CUSTOM PROCEDURE TRAY) ×3 IMPLANT
KIT ROOM TURNOVER OR (KITS) ×3 IMPLANT
MESH VENTRALEX ST 2.5 CRC MED (Mesh General) ×2 IMPLANT
NDL HYPO 25GX1X1/2 BEV (NEEDLE) ×1 IMPLANT
NEEDLE HYPO 25GX1X1/2 BEV (NEEDLE) ×3 IMPLANT
NS IRRIG 1000ML POUR BTL (IV SOLUTION) ×3 IMPLANT
PACK SURGICAL SETUP 50X90 (CUSTOM PROCEDURE TRAY) ×3 IMPLANT
PAD ARMBOARD 7.5X6 YLW CONV (MISCELLANEOUS) ×3 IMPLANT
PENCIL BUTTON HOLSTER BLD 10FT (ELECTRODE) ×3 IMPLANT
SPONGE GAUZE 2X2 STER 10/PKG (GAUZE/BANDAGES/DRESSINGS)
SPONGE GAUZE 4X4 12PLY STER LF (GAUZE/BANDAGES/DRESSINGS) ×2 IMPLANT
SPONGE LAP 18X18 X RAY DECT (DISPOSABLE) ×3 IMPLANT
STRIP CLOSURE SKIN 1/2X4 (GAUZE/BANDAGES/DRESSINGS) ×2 IMPLANT
SUT MNCRL AB 4-0 PS2 18 (SUTURE) ×3 IMPLANT
SUT NOVA 1 T20/GS 25DT (SUTURE) ×2 IMPLANT
SUT NOVA NAB DX-16 0-1 5-0 T12 (SUTURE) ×2 IMPLANT
SUT NOVA NAB GS-21 0 18 T12 DT (SUTURE) ×3 IMPLANT
SUT NOVA NAB GS-21 1 T12 (SUTURE) ×3 IMPLANT
SUT VIC AB 3-0 SH 27 (SUTURE) ×3
SUT VIC AB 3-0 SH 27X BRD (SUTURE) ×1 IMPLANT
SYR BULB 3OZ (MISCELLANEOUS) ×3 IMPLANT
SYR CONTROL 10ML LL (SYRINGE) ×3 IMPLANT
TOWEL OR 17X24 6PK STRL BLUE (TOWEL DISPOSABLE) ×1 IMPLANT
TOWEL OR 17X26 10 PK STRL BLUE (TOWEL DISPOSABLE) ×3 IMPLANT
TUBE CONNECTING 12'X1/4 (SUCTIONS)
TUBE CONNECTING 12X1/4 (SUCTIONS) IMPLANT
YANKAUER SUCT BULB TIP NO VENT (SUCTIONS) IMPLANT

## 2014-06-22 NOTE — Anesthesia Preprocedure Evaluation (Addendum)
Anesthesia Evaluation  Patient identified by MRN, date of birth, ID band Patient awake    Reviewed: Allergy & Precautions, H&P , NPO status , Patient's Chart, lab work & pertinent test results  Airway Mallampati: III  TM Distance: >3 FB Neck ROM: Full    Dental no notable dental hx. (+) Teeth Intact, Dental Advisory Given   Pulmonary shortness of breath and with exertion, sleep apnea and Continuous Positive Airway Pressure Ventilation , former smoker,  breath sounds clear to auscultation  Pulmonary exam normal       Cardiovascular negative cardio ROS  Rhythm:Regular Rate:Normal     Neuro/Psych negative neurological ROS  negative psych ROS   GI/Hepatic Neg liver ROS, hiatal hernia, GERD-  Controlled,  Endo/Other  Morbid obesity  Renal/GU negative Renal ROS  negative genitourinary   Musculoskeletal   Abdominal   Peds  Hematology negative hematology ROS (+)   Anesthesia Other Findings   Reproductive/Obstetrics negative OB ROS                      Anesthesia Physical Anesthesia Plan  ASA: III  Anesthesia Plan: General   Post-op Pain Management:    Induction: Intravenous  Airway Management Planned: Oral ETT  Additional Equipment:   Intra-op Plan:   Post-operative Plan: Extubation in OR  Informed Consent: I have reviewed the patients History and Physical, chart, labs and discussed the procedure including the risks, benefits and alternatives for the proposed anesthesia with the patient or authorized representative who has indicated his/her understanding and acceptance.   Dental advisory given  Plan Discussed with: CRNA and Surgeon  Anesthesia Plan Comments:        Anesthesia Quick Evaluation

## 2014-06-22 NOTE — Transfer of Care (Signed)
Immediate Anesthesia Transfer of Care Note  Patient: Harry Hall  Procedure(s) Performed: Procedure(s): UMBILICAL HERNIA REPAIR WITH MESH (N/A) INSERTION OF MESH (N/A)  Patient Location: PACU  Anesthesia Type:General  Level of Consciousness: awake, alert  and oriented  Airway & Oxygen Therapy: Patient connected to face mask oxygen  Post-op Assessment: Report given to RN  Post vital signs: stable  Last Vitals:  Filed Vitals:   06/22/14 0903  BP: 132/66  Pulse: 75  Temp: 36.1 C  Resp: 16    Complications: No apparent anesthesia complications

## 2014-06-22 NOTE — Interval H&P Note (Signed)
History and Physical Interval Note:  06/22/2014 8:41 AM  Harry Hall  has presented today for surgery, with the diagnosis of umbilical hernia  The various methods of treatment have been discussed with the patient and family. After consideration of risks, benefits and other options for treatment, the patient has consented to  Procedure(s): UMBILICAL HERNIA REPAIR WITH MESH (N/A) INSERTION OF MESH (N/A) as a surgical intervention .  The patient's history has been reviewed, patient examined, no change in status, stable for surgery.  I have reviewed the patient's chart and labs.  Questions were answered to the patient's satisfaction.     Savien Mamula K.

## 2014-06-22 NOTE — Anesthesia Postprocedure Evaluation (Signed)
Anesthesia Post Note  Patient: Harry Hall  Procedure(s) Performed: Procedure(s) (LRB): UMBILICAL HERNIA REPAIR WITH MESH (N/A) INSERTION OF MESH (N/A)  Anesthesia type: general  Patient location: PACU  Post pain: Pain level controlled  Post assessment: Patient's Cardiovascular Status Stable  Last Vitals:  Filed Vitals:   06/22/14 1416  BP: 127/69  Pulse: 74  Temp:   Resp: 16    Post vital signs: Reviewed and stable  Level of consciousness: sedated  Complications: No apparent anesthesia complications

## 2014-06-22 NOTE — Op Note (Signed)
Indications:  The patient presented with a history of an enlarging non-reducible umbilical hernia.  The patient was examined and we recommended umbilical hernia repair with mesh.  Pre-operative diagnosis:  Umbilical hernia  Post-operative diagnosis:  Same  Surgeon: Jamone Garrido K.   Assistants: None  Anesthesia: General endotracheal anesthesia  ASA Class: 2   Procedure Details  The patient was seen again in the Holding Room. The risks, benefits, complications, treatment options, and expected outcomes were discussed with the patient. The possibilities of reaction to medication, pulmonary aspiration, perforation of viscus, bleeding, recurrent infection, the need for additional procedures, and development of a complication requiring transfusion or further operation were discussed with the patient and/or family. There was concurrence with the proposed plan, and informed consent was obtained. The site of surgery was properly noted/marked. The patient was taken to the Operating Room, identified as Harry Hall, and the procedure verified as umbilical hernia repair. A Time Out was held and the above information confirmed.  After an adequate level of general anesthesia was obtained, the patient's abdomen was prepped with Chloraprep and draped in sterile fashion.  We made a transverse incision above the umbilicus.  Dissection was carried down to the hernia sac with cautery.  We dissected bluntly around the hernia sac down to the edge of the fascial defect.  The hernia sac was opened and contained only some omentum.  The omentum was reduced back into the peritoneum. The fascial defect measured 3 cm.  We cleared the fascia in all directions.  A medium Ventralex mesh was inserted into the pre-peritoneal space and was deployed.  The mesh was secured with eight trans-fascial sutures of 0 Novofil.  The fascial defect was closed with multiple interrupted figure-of-eight 1 Novofil sutures.  The base of the  umbilicus was tacked down with 3-0 Vicryl.  3-0 Vicryl was used to close the subcutaneous tissues and 4-0 Monocryl was used to close the skin.  Steri-strips and clean dressing were applied.  The patient was extubated and brought to the recovery room in stable condition.  All sponge, instrument, and needle counts were correct prior to closure and at the conclusion of the case.   Estimated Blood Loss: Minimal          Complications: None; patient tolerated the procedure well.         Disposition: PACU - hemodynamically stable.         Condition: stable   [name]. Georgette Dover, MD, [facility]  General/ Trauma Surgery  06/22/2014 12:54 PM

## 2014-06-22 NOTE — H&P (View-Only) (Signed)
History of Present Illness Harry Hall. Harry Sporer MD; 05/23/2014 1:27 PM) Patient words: hernia.  The patient is a 64 year old male who presents with an umbilical hernia. Referred by Dr. Molli Barrows for evaluation of umbilical hernia. The patient was evaluated in 2012 for an enlarging umbilical hernia. At that time he chose not to have it repaired as a financial reasons. Over the last 3-1/2 years it has become noticeably larger. It occasionally causes some significant discomfort. He denies any obstructive symptoms. He continues to have regular bowel movements. The patient had an accident at work and had a severe injury to his ankle. He is currently out of work and has had excessive ankle surgery over the last year. He would like to have the hernia repaired while he is out of work. Unfortunately, while he has been out of work he has put on about 30 pounds of weight. Other Problems [name], CMA; 05/23/2014 10:30 AM) Hypercholesterolemia  Past Surgical History [name], CMA; 05/23/2014 10:30 AM) Foot Surgery Left.  Diagnostic Studies History [name], CMA; 05/23/2014 10:30 AM) Colonoscopy 5-10 years ago  Allergies [name], [place]; 05/23/2014 10:31 AM) No Known Drug Allergies 05/23/2014  Medication History [name], CMA; 05/23/2014 10:31 AM) [name] Aspirin (81MG  Tablet Chewable, Oral) Active. ZyrTEC Allergy (10MG  Tablet, Oral) Active. Fish Oil Active.  Social History [name], CMA; 05/23/2014 10:30 AM) Alcohol use Moderate alcohol use. Caffeine use Carbonated beverages. No drug use Tobacco use Former smoker.  Family History [name], [place]; 05/23/2014 10:30 AM) Malignant Neoplasm Of Pancreas Father.     Review of Systems [name] CMA; 05/23/2014 10:30 AM) General Present- Weight Gain. Not Present- Appetite Loss, Chills, Fatigue, Fever, Night Sweats and Weight Loss. Skin Not Present- Change in Wart/Mole, Dryness,  Hives, Jaundice, New Lesions, Non-Healing Wounds, Rash and Ulcer. HEENT Not Present- Earache, Hearing Loss, Hoarseness, Nose Bleed, Oral Ulcers, Ringing in the Ears, Seasonal Allergies, Sinus Pain, Sore Throat, Visual Disturbances, Wears glasses/contact lenses and Yellow Eyes. Respiratory Not Present- Bloody sputum, Chronic Cough, Difficulty Breathing, Snoring and Wheezing. Breast Not Present- Breast Mass, Breast Pain, Nipple Discharge and Skin Changes. Cardiovascular Not Present- Chest Pain, Difficulty Breathing Lying Down, Leg Cramps, Palpitations, Rapid Heart Rate, Shortness of Breath and Swelling of Extremities. Gastrointestinal Not Present- Abdominal Pain, Bloating, Bloody Stool, Change in Bowel Habits, Chronic diarrhea, Constipation, Difficulty Swallowing, Excessive gas, Gets full quickly at meals, Hemorrhoids, Indigestion, Nausea, Rectal Pain and Vomiting. Male Genitourinary Not Present- Blood in Urine, Change in Urinary Stream, Frequency, Impotence, Nocturia, Painful Urination, Urgency and Urine Leakage. Musculoskeletal Not Present- Back Pain, Joint Pain, Joint Stiffness, Muscle Pain, Muscle Weakness and Swelling of Extremities. Neurological Not Present- Decreased Memory, Fainting, Headaches, Numbness, Seizures, Tingling, Tremor, Trouble walking and Weakness. Psychiatric Not Present- Anxiety, Bipolar, Change in Sleep Pattern, Depression, Fearful and Frequent crying. Endocrine Not Present- Cold Intolerance, Excessive Hunger, Hair Changes, Heat Intolerance, Hot flashes and New Diabetes. Hematology Not Present- Easy Bruising, Excessive bleeding, Gland problems, HIV and Persistent Infections.  Vitals [name] CMA; 05/23/2014 10:32 AM) 05/23/2014 10:31 AM Weight: 275 lb Height: 68in Body Surface Area: 2.45 m Body Mass Index: 41.81 kg/m Temp.: 98.88F  Pulse: 73 (Regular)  BP: 140/88 (Sitting, Left Arm, Standard)     Physical Exam Rodman Key K. Satomi Buda MD; 05/23/2014 1:28  PM)  The physical exam findings are as follows: Note:WDWN in NAD HEENT: EOMI, sclera anicteric Neck: No masses, no thyromegaly Lungs: CTA bilaterally; normal respiratory effort CV: Regular rate and rhythm; no murmurs  Abd: +bowel sounds, soft, obese; large upper midline rectus diastasis; large umbilical hernia - sac measuring about 10 cm across; partially reducible; no skin discoloration Ext: Well-perfused; no edema Skin: Warm, dry; no sign of jaundice    Assessment & Plan Rodman Key K. Kairon Shock MD; 1/94/1740 81:44 AM)  UMBILICAL HERNIA, INCARCERATED (552.1  K42.0)  Current Plans Schedule for Surgery - Umbilical hernia repair with mesh. The surgical procedure has been discussed with the patient. Potential risks, benefits, alternative treatments, and expected outcomes have been explained. All of the patient's questions at this time have been answered. The likelihood of reaching the patient's treatment goal is good. The patient understand the proposed surgical procedure and wishes to proceed.  Harry Hall. Georgette Dover, MD, [facility]  General/ Trauma Surgery  05/23/2014 1:29 PM

## 2014-06-22 NOTE — Anesthesia Procedure Notes (Signed)
Procedure Name: Intubation Date/Time: 06/22/2014 11:48 AM Performed by: Maeola Harman Pre-anesthesia Checklist: Patient identified, Emergency Drugs available, Suction available, Patient being monitored and Timeout performed Patient Re-evaluated:Patient Re-evaluated prior to inductionOxygen Delivery Method: Circle system utilized Preoxygenation: Pre-oxygenation with 100% oxygen Intubation Type: IV induction Ventilation: Mask ventilation without difficulty Laryngoscope Size: Mac and 4 Grade View: Grade I Tube size: 7.5 mm Number of attempts: 1 Airway Equipment and Method: Stylet Placement Confirmation: ETT inserted through vocal cords under direct vision,  positive ETCO2 and breath sounds checked- equal and bilateral Secured at: 22 cm Tube secured with: Tape Dental Injury: Teeth and Oropharynx as per pre-operative assessment

## 2014-06-22 NOTE — Discharge Instructions (Signed)
[facility] ° °UMBILICAL HERNIA REPAIR: POST OP INSTRUCTIONS ° °Always review your discharge instruction sheet given to you by the facility where your surgery was performed. °IF YOU HAVE DISABILITY OR FAMILY LEAVE FORMS, YOU MUST BRING THEM TO THE OFFICE FOR PROCESSING.   °DO NOT GIVE THEM TO YOUR DOCTOR. ° °1. A  prescription for pain medication may be given to you upon discharge.  Take your pain medication as prescribed, if needed.  If narcotic pain medicine is not needed, then you may take acetaminophen (Tylenol) or ibuprofen (Advil) as needed. °2. Take your usually prescribed medications unless otherwise directed. °3. If you need a refill on your pain medication, please contact your pharmacy.  They will contact our office to request authorization. Prescriptions will not be filled after 5 pm or on week-ends. °4. You should follow a light diet the first 24 hours after arrival home, such as soup and crackers, etc.  Be sure to include lots of fluids daily.  Resume your normal diet the day after surgery. °5. Most patients will experience some swelling and bruising around the umbilicus or in the groin and scrotum.  Ice packs and reclining will help.  Swelling and bruising can take several days to resolve.  °6. It is common to experience some constipation if taking pain medication after surgery.  Increasing fluid intake and taking a stool softener (such as Colace) will usually help or prevent this problem from occurring.  A mild laxative (Milk of Magnesia or Miralax) should be taken according to package directions if there are no bowel movements after 48 hours. °7. Unless discharge instructions indicate otherwise, you may remove your bandages 24-48 hours after surgery, and you may shower at that time.  You will have steri-strips (small skin tapes) in place directly over the incision.  These strips should be left on the skin for 7-10 days. °8. ACTIVITIES:  You may resume regular (light) daily activities  beginning the next day--such as daily self-care, walking, climbing stairs--gradually increasing activities as tolerated.  You may have sexual intercourse when it is comfortable.  Refrain from any heavy lifting or straining until approved by your doctor. °a. You may drive when you are no longer taking prescription pain medication, you can comfortably wear a seatbelt, and you can safely maneuver your car and apply brakes. °b. RETURN TO WORK:  2-3 weeks with light duty - no lifting over 15 lbs. °9. You should see your doctor in the office for a follow-up appointment approximately 2-3 weeks after your surgery.  Make sure that you call for this appointment within a day or two after you arrive home to insure a convenient appointment time. °10. OTHER INSTRUCTIONS:  __________________________________________________________________________________________________________________________________________________________________________________________  °WHEN TO CALL YOUR DOCTOR: °1. Fever over 101.0 °2. Inability to urinate °3. Nausea and/or vomiting °4. Extreme swelling or bruising °5. Continued bleeding from incision. °6. Increased pain, redness, or drainage from the incision ° °The clinic staff is available to answer your questions during regular business hours.  Please don’t hesitate to call and ask to speak to one of the nurses for clinical concerns.  If you have a medical emergency, go to the nearest emergency room or call 911.  A surgeon from [facility] is always on call at the hospital ° ° °[address], [address], [place], [place]  [address] ? ° [address] [address], [place], [place]   [address] °[phone]    [phone]    FAX [phone] °Web site: [email or web address] ° ° °

## 2014-06-24 ENCOUNTER — Encounter (HOSPITAL_COMMUNITY): Payer: Self-pay | Admitting: Surgery

## 2014-07-27 DIAGNOSIS — D126 Benign neoplasm of colon, unspecified: Secondary | ICD-10-CM | POA: Insufficient documentation

## 2015-02-02 DIAGNOSIS — M25519 Pain in unspecified shoulder: Secondary | ICD-10-CM | POA: Insufficient documentation

## 2015-02-02 DIAGNOSIS — G4733 Obstructive sleep apnea (adult) (pediatric): Secondary | ICD-10-CM | POA: Insufficient documentation

## 2015-02-02 DIAGNOSIS — M25579 Pain in unspecified ankle and joints of unspecified foot: Secondary | ICD-10-CM | POA: Insufficient documentation

## 2015-05-17 DIAGNOSIS — G4733 Obstructive sleep apnea (adult) (pediatric): Secondary | ICD-10-CM | POA: Diagnosis not present

## 2015-05-23 DIAGNOSIS — R262 Difficulty in walking, not elsewhere classified: Secondary | ICD-10-CM | POA: Diagnosis not present

## 2015-05-23 DIAGNOSIS — G4733 Obstructive sleep apnea (adult) (pediatric): Secondary | ICD-10-CM | POA: Diagnosis not present

## 2015-05-23 DIAGNOSIS — R29898 Other symptoms and signs involving the musculoskeletal system: Secondary | ICD-10-CM | POA: Diagnosis not present

## 2015-06-23 DIAGNOSIS — R262 Difficulty in walking, not elsewhere classified: Secondary | ICD-10-CM | POA: Diagnosis not present

## 2015-06-23 DIAGNOSIS — G4733 Obstructive sleep apnea (adult) (pediatric): Secondary | ICD-10-CM | POA: Diagnosis not present

## 2015-06-23 DIAGNOSIS — R29898 Other symptoms and signs involving the musculoskeletal system: Secondary | ICD-10-CM | POA: Diagnosis not present

## 2015-06-27 MED FILL — ATORVASTATIN 80 MG TABLET: 80 | 90 days supply | Qty: 90 | Fill #0

## 2015-07-21 DIAGNOSIS — R29898 Other symptoms and signs involving the musculoskeletal system: Secondary | ICD-10-CM | POA: Diagnosis not present

## 2015-07-21 DIAGNOSIS — G4733 Obstructive sleep apnea (adult) (pediatric): Secondary | ICD-10-CM | POA: Diagnosis not present

## 2015-07-21 DIAGNOSIS — R262 Difficulty in walking, not elsewhere classified: Secondary | ICD-10-CM | POA: Diagnosis not present

## 2015-08-21 DIAGNOSIS — G4733 Obstructive sleep apnea (adult) (pediatric): Secondary | ICD-10-CM | POA: Diagnosis not present

## 2015-08-21 DIAGNOSIS — R29898 Other symptoms and signs involving the musculoskeletal system: Secondary | ICD-10-CM | POA: Diagnosis not present

## 2015-08-21 DIAGNOSIS — R262 Difficulty in walking, not elsewhere classified: Secondary | ICD-10-CM | POA: Diagnosis not present

## 2015-09-05 ENCOUNTER — Ambulatory Visit: Payer: 59 | Admitting: Podiatry

## 2015-09-05 ENCOUNTER — Ambulatory Visit: Payer: Self-pay

## 2015-09-05 ENCOUNTER — Encounter: Payer: 59 | Admitting: Podiatry

## 2015-09-05 DIAGNOSIS — T847XXA Infection and inflammatory reaction due to other internal orthopedic prosthetic devices, implants and grafts, initial encounter: Secondary | ICD-10-CM

## 2015-09-07 NOTE — Progress Notes (Signed)
This encounter was created in error - please disregard.

## 2015-09-19 ENCOUNTER — Encounter: Payer: Self-pay | Admitting: Sports Medicine

## 2015-09-19 ENCOUNTER — Ambulatory Visit (INDEPENDENT_AMBULATORY_CARE_PROVIDER_SITE_OTHER): Payer: 59 | Admitting: Sports Medicine

## 2015-09-19 ENCOUNTER — Ambulatory Visit: Payer: Self-pay

## 2015-09-19 ENCOUNTER — Ambulatory Visit (INDEPENDENT_AMBULATORY_CARE_PROVIDER_SITE_OTHER): Payer: 59

## 2015-09-19 VITALS — BP 136/74 | HR 78 | Resp 16 | Ht 68.0 in | Wt 285.0 lb

## 2015-09-19 DIAGNOSIS — M79672 Pain in left foot: Secondary | ICD-10-CM | POA: Diagnosis not present

## 2015-09-19 DIAGNOSIS — M12572 Traumatic arthropathy, left ankle and foot: Secondary | ICD-10-CM | POA: Diagnosis not present

## 2015-09-19 DIAGNOSIS — G5752 Tarsal tunnel syndrome, left lower limb: Secondary | ICD-10-CM | POA: Diagnosis not present

## 2015-09-19 DIAGNOSIS — M25572 Pain in left ankle and joints of left foot: Secondary | ICD-10-CM

## 2015-09-19 DIAGNOSIS — M76822 Posterior tibial tendinitis, left leg: Secondary | ICD-10-CM

## 2015-09-19 MED ORDER — TRIAMCINOLONE ACETONIDE 10 MG/ML IJ SUSP: 10.0000 mg | Freq: Once | INTRAMUSCULAR | Status: AC

## 2015-09-19 NOTE — Progress Notes (Signed)
Patient ID: Harry Hall, male   DOB: 11/25/1950, 65 y.o.   MRN: [id number] Subjective: Harry Hall is a 65 y.o. male patient who presents to office for evaluation of left foot and ankle pain. Patient complains of continued pain in the ankle and foot since a fall from ladder in 09/2013; patient had surgery and since continues to have pain and swelling; reports he had compression garment and was icing with no improvement, patient also states that he went to PT twice with no functional improvement. Reports pain with turning the foot outward and states that it is difficult on uneven surfaces. Patient states that he tried Motrin as well until it started to increase his blood pressure. Patient denies any other pedal complaints.   Review of Systems  Constitutional: Positive for fatigue.  Respiratory: Positive for shortness of breath.  Cardiovascular: Positive for leg swelling.  All other systems reviewed and are negative.   Patient Active Problem List   Diagnosis Date Noted  . Ankle pain 02/02/2015  . Pain in shoulder 02/02/2015  . Obstructive apnea 02/02/2015  . Adenomatous colon polyp 07/27/2014  . Morbid (severe) obesity due to excess calories ([place]) 05/16/2014  . Calcaneus fracture, left 10/08/2013  . Exomphalos 09/21/2012  . HLD (hyperlipidemia) 09/19/2011    Current Outpatient Prescriptions on File Prior to Visit  Medication Sig Dispense Refill  . aspirin 81 MG tablet Take 81 mg by mouth daily.      Marland Kitchen atorvastatin (LIPITOR) 80 MG tablet Take 80 mg by mouth daily.      . cetirizine (ZYRTEC) 10 MG tablet Take 10 mg by mouth daily.    . fish oil-omega-3 fatty acids 1000 MG capsule Take 1 g by mouth daily.      No current facility-administered medications on file prior to visit.    No Known Allergies  Objective:  General: Alert and oriented x3 in no acute distress  Dermatology: No open lesions bilateral lower extremities, no webspace macerations, no ecchymosis bilateral,  all nails x 10 are well manicured.  Vascular: Dorsalis Pedis and Posterior Tibial pedal pulses palpable, Capillary Fill Time 3 seconds, scant pedal hair growth bilateral, trace edema bilateral lower extremities L>R, Temperature gradient within normal limits.  Neurology: Johney Maine sensation intact via light touch bilateral. (- )Tinels sign bilateral.   Musculoskeletal: Mild tenderness with palpation at Left posterior tibial tendon and sinus tarsi. Negative talar tilt, Negative tib-fib stress, No instability. No pain with calf compression bilateral. No acute concern for DVT bilateral. Range of motion within normal limits with mild clicking at left ankle. Strength within normal limits in all groups bilateral.   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 Mild soft tissue swelling.   Assessment and Plan: Problem List Items Addressed This Visit      Other   Ankle pain    Other Visit Diagnoses    Foot pain, left    -  Primary    Relevant Medications    triamcinolone acetonide (KENALOG) 10 MG/ML injection 10 mg    Other Relevant Orders    DG Foot 2 Views Left    DG Ankle 2 Views Left    Sinus tarsi syndrome of left ankle        Relevant Medications    triamcinolone acetonide (KENALOG) 10 MG/ML  injection 10 mg    Posterior tibial tendonitis, left        Relevant Medications    triamcinolone acetonide (KENALOG) 10 MG/ML injection 10 mg    Traumatic arthritis of foot, left        Relevant Medications    triamcinolone acetonide (KENALOG) 10 MG/ML injection 10 mg       -Complete examination performed -Xrays reviewed -Discussed treatement options for tendonitis with arthritic changes secondary to past trauma After oral consent and aseptic prep, injected a mixture  containing 1 ml of 2%  plain lidocaine, 1 ml 0.5% plain marcaine, 0.5 ml of kenalog 10 and 0.5 ml of dexamethasone phosphate into left PT tendon and left sinus tarsi without complication. Post-injection care discussed with patient.  -Advised ice and gentle range of motion left foot and ankle daily -Dispensed tri-lock ankle brace to wear as instructed. Patient may benefit from custom brace in the future -Return to use compression garment when not using ankle brace as tolerated -Patient to return to office in 6 weeks or sooner if condition worsens.  Landis Martins, DPM

## 2015-09-19 NOTE — Progress Notes (Deleted)
   Subjective:    Patient ID: Harry Hall, male    DOB: 11-15-1950, 65 y.o.   MRN: [id number]  HPI    Review of Systems  Constitutional: Positive for fatigue.  Respiratory: Positive for shortness of breath.   Cardiovascular: Positive for leg swelling.  All other systems reviewed and are negative.      Objective:   Physical Exam        Assessment & Plan:

## 2015-09-20 ENCOUNTER — Telehealth: Payer: Self-pay | Admitting: *Deleted

## 2015-09-20 DIAGNOSIS — R29898 Other symptoms and signs involving the musculoskeletal system: Secondary | ICD-10-CM | POA: Diagnosis not present

## 2015-09-20 DIAGNOSIS — R262 Difficulty in walking, not elsewhere classified: Secondary | ICD-10-CM | POA: Diagnosis not present

## 2015-09-20 DIAGNOSIS — G4733 Obstructive sleep apnea (adult) (pediatric): Secondary | ICD-10-CM | POA: Diagnosis not present

## 2015-09-20 NOTE — Telephone Encounter (Addendum)
Pt states he received an ankle brace yesterday and has forgotten how to put on, could we send instructions.  I left a message informing pt it would be easier to let one of the staff show him and we would also give him written instructions.  09/21/2015-Pt states he would like to come in to be instructed on the application of the Tri-Lock brace.  I left message encouraging pt to come in between 100pm to 500pm today.  Pt presents to office for instruction on applying Tri-Lok brace to his left ankle.  I copied the instruction from another Tri-Lok pack, and gave to the pt. Pt has the compression sock portion of the brace in place on the left foot and ankle, with the strap in hand.  I had pt sit in the visitor chair, placed his left foot and ankle at 90 degrees on lateral and anterior/posterior positions and reattached the strap with the inside label so the pt could read it and positioned the lateral strap over the midfoot, under the foot and up the lateral ankle to the velcro strip, then the medial strap over the midfoot, under the foot and up the medial ankle and to the velcro strip.  Pt states he is using the brace for support and hasn't had any pain since beginning to wear it.  I instructed pt to call with concerns.

## 2015-10-16 MED FILL — ATORVASTATIN 80 MG TABLET: 80 | 90 days supply | Qty: 90 | Fill #1

## 2015-10-21 DIAGNOSIS — R262 Difficulty in walking, not elsewhere classified: Secondary | ICD-10-CM | POA: Diagnosis not present

## 2015-10-21 DIAGNOSIS — G4733 Obstructive sleep apnea (adult) (pediatric): Secondary | ICD-10-CM | POA: Diagnosis not present

## 2015-10-21 DIAGNOSIS — R29898 Other symptoms and signs involving the musculoskeletal system: Secondary | ICD-10-CM | POA: Diagnosis not present

## 2015-10-31 ENCOUNTER — Encounter: Payer: Self-pay | Admitting: Sports Medicine

## 2015-10-31 ENCOUNTER — Ambulatory Visit (INDEPENDENT_AMBULATORY_CARE_PROVIDER_SITE_OTHER): Payer: 59 | Admitting: Sports Medicine

## 2015-10-31 DIAGNOSIS — G5752 Tarsal tunnel syndrome, left lower limb: Secondary | ICD-10-CM

## 2015-10-31 DIAGNOSIS — M12572 Traumatic arthropathy, left ankle and foot: Secondary | ICD-10-CM

## 2015-10-31 DIAGNOSIS — M76822 Posterior tibial tendinitis, left leg: Secondary | ICD-10-CM

## 2015-10-31 DIAGNOSIS — M25572 Pain in left ankle and joints of left foot: Secondary | ICD-10-CM | POA: Diagnosis not present

## 2015-10-31 DIAGNOSIS — M79672 Pain in left foot: Secondary | ICD-10-CM | POA: Diagnosis not present

## 2015-10-31 MED ORDER — MELOXICAM 15 MG PO TABS: 15.0000 mg | ORAL_TABLET | Freq: Every day | ORAL | Status: DC

## 2015-10-31 MED FILL — MELOXICAM 15 MG TABLET: 15 | 30 days supply | Qty: 30 | Fill #0

## 2015-10-31 NOTE — Progress Notes (Signed)
Patient ID: DEMAJE CUARTAS, male   DOB: [birth year], 65 y.o.   MRN: [id number]   Subjective: Harry Hall is a 65 y.o. male patient who returns to office for follow up evaluation of left foot and ankle pain. Patient states that he is doing much better after injection that was given 6 weeks ago and that he has had less pain in his foot and ankle; states that he has been using the brace and it has helped to support him and helped with the swelling. Patient denies any other pedal complaints.    Patient Active Problem List   Diagnosis Date Noted  . Ankle pain 02/02/2015  . Pain in shoulder 02/02/2015  . Obstructive apnea 02/02/2015  . Adenomatous colon polyp 07/27/2014  . Morbid (severe) obesity due to excess calories ([place]) 05/16/2014  . Calcaneus fracture, left 10/08/2013  . Exomphalos 09/21/2012  . HLD (hyperlipidemia) 09/19/2011    Current Outpatient Prescriptions on File Prior to Visit  Medication Sig Dispense Refill  . aspirin 81 MG tablet Take 81 mg by mouth daily.      Marland Kitchen atorvastatin (LIPITOR) 80 MG tablet Take 80 mg by mouth daily.      . cetirizine (ZYRTEC) 10 MG tablet Take 10 mg by mouth daily.    . fish oil-omega-3 fatty acids 1000 MG capsule Take 1 g by mouth daily.      Current Facility-Administered Medications on File Prior to Visit  Medication Dose Route Frequency Provider Last Rate Last Dose  . triamcinolone acetonide (KENALOG) 10 MG/ML injection 10 mg  10 mg Other Once [employer], DPM        No Known Allergies  Objective:  General: Alert and oriented x3 in no acute distress  Dermatology: No open lesions bilateral lower extremities, no webspace macerations, no ecchymosis bilateral, all nails x 10 are well manicured.  Vascular: Dorsalis Pedis and Posterior Tibial pedal pulses palpable, Capillary Fill Time 3 seconds, scant pedal hair growth bilateral, trace edema bilateral lower extremities L>R, improved, Temperature gradient within normal  limits.  Neurology: Johney Maine sensation intact via light touch bilateral. (- )Tinels sign bilateral.   Musculoskeletal: Decreased tenderness with palpation at Left posterior tibial tendon and sinus tarsi. Negative talar tilt, Negative tib-fib stress, No instability. No pain with calf compression bilateral. No acute concern for DVT bilateral. Range of motion within normal limits with mild clicking at left ankle, improved. Strength within normal limits in all groups bilateral.    Assessment and Plan: Problem List Items Addressed This Visit      Other   Ankle pain   Relevant Medications   meloxicam (MOBIC) 15 MG tablet    Other Visit Diagnoses    Foot pain, left    -  Primary    Relevant Medications    meloxicam (MOBIC) 15 MG tablet    Sinus tarsi syndrome of left ankle        Relevant Medications    meloxicam (MOBIC) 15 MG tablet    Posterior tibial tendonitis, left        Traumatic arthritis of foot, left        Relevant Medications    meloxicam (MOBIC) 15 MG tablet       -Complete examination performed -Discussed treatement options for tendonitis with arthritic changes secondary to past trauma -No re-injection since pain is much improved -Rx Mobic to take as needed to prevent flares up since arthritis is present -Advised continue with ice and gentle range of motion left foot  and ankle daily -Continue with tri-lock ankle brace to wear as instructed. Patient may benefit from custom brace in the future -Return to use compression garment when not using ankle brace as tolerated -Patient to return to office in 8 weeks or sooner if condition worsens. If not better at next visit will consider re-injection and PT.   Landis Martins, DPM

## 2016-01-01 ENCOUNTER — Encounter: Payer: Self-pay | Admitting: Sports Medicine

## 2016-01-01 ENCOUNTER — Ambulatory Visit (INDEPENDENT_AMBULATORY_CARE_PROVIDER_SITE_OTHER): Payer: 59 | Admitting: Sports Medicine

## 2016-01-01 DIAGNOSIS — M76822 Posterior tibial tendinitis, left leg: Secondary | ICD-10-CM | POA: Diagnosis not present

## 2016-01-01 DIAGNOSIS — M25572 Pain in left ankle and joints of left foot: Secondary | ICD-10-CM | POA: Diagnosis not present

## 2016-01-01 DIAGNOSIS — M79672 Pain in left foot: Secondary | ICD-10-CM

## 2016-01-01 DIAGNOSIS — G5752 Tarsal tunnel syndrome, left lower limb: Secondary | ICD-10-CM | POA: Diagnosis not present

## 2016-01-01 DIAGNOSIS — M12572 Traumatic arthropathy, left ankle and foot: Secondary | ICD-10-CM

## 2016-01-01 NOTE — Progress Notes (Signed)
Patient ID: SHEM KLOUDA, male   DOB: 05-Jun-1950, 65 y.o.   MRN: [id number]   Subjective: Harry Hall is a 65 y.o. male patient who returns to office for follow up evaluation of left foot and ankle pain. Patient states that he is doing much better after injection that was given 14 weeks ago and that he has had less pain in his foot and ankle; states that he has been using the brace and it has helped to support him and helped with the swelling and has not taken Mobic. Reports that he went on a cruise and was able to tolerate the beach just fine, ankle feels as good as it will get. Patient denies any other pedal complaints.    Patient Active Problem List   Diagnosis Date Noted  . Ankle pain 02/02/2015  . Pain in shoulder 02/02/2015  . Obstructive apnea 02/02/2015  . Adenomatous colon polyp 07/27/2014  . Morbid (severe) obesity due to excess calories ([place]) 05/16/2014  . Calcaneus fracture, left 10/08/2013  . Exomphalos 09/21/2012  . HLD (hyperlipidemia) 09/19/2011    Current Outpatient Prescriptions on File Prior to Visit  Medication Sig Dispense Refill  . aspirin 81 MG tablet Take 81 mg by mouth daily.      Marland Kitchen atorvastatin (LIPITOR) 80 MG tablet Take 80 mg by mouth daily.      . cetirizine (ZYRTEC) 10 MG tablet Take 10 mg by mouth daily.    . fish oil-omega-3 fatty acids 1000 MG capsule Take 1 g by mouth daily.     . meloxicam (MOBIC) 15 MG tablet Take 1 tablet (15 mg total) by mouth daily. 30 tablet 0   Current Facility-Administered Medications on File Prior to Visit  Medication Dose Route Frequency Provider Last Rate Last Dose  . triamcinolone acetonide (KENALOG) 10 MG/ML injection 10 mg  10 mg Other Once [employer], DPM        No Known Allergies  Objective:  General: Alert and oriented x3 in no acute distress  Dermatology: No open lesions bilateral lower extremities, no webspace macerations, no ecchymosis bilateral, all nails x 10 are well manicured.  Vascular:  Dorsalis Pedis and Posterior Tibial pedal pulses palpable, Capillary Fill Time 3 seconds, scant pedal hair growth bilateral, trace edema bilateral lower extremities L>R, improved, Temperature gradient within normal limits.  Neurology: Johney Maine sensation intact via light touch bilateral. (- )Tinels sign bilateral.   Musculoskeletal: No tenderness with palpation at Left posterior tibial tendon and sinus tarsi. Negative talar tilt, Negative tib-fib stress, No instability. No pain with calf compression bilateral. No acute concern for DVT bilateral. Range of motion within normal limits with mild clicking at left ankle, improved. Strength within normal limits in all groups bilateral.    Assessment and Plan: Problem List Items Addressed This Visit      Other   Ankle pain - Primary    Other Visit Diagnoses    Sinus tarsi syndrome of left ankle       Foot pain, left       Posterior tibial tendonitis, left       Traumatic arthritis of foot, left          -Complete examination performed -Discussed treatement options for tendonitis with arthritic changes secondary to past trauma -No re-injection since pain is resolved -Advised patient to take Mobic as  Previously Rx as needed to prevent flares up since arthritis is present -Advised continue with ice and gentle range of motion left foot and ankle  daily -Continue with tri-lock ankle brace to wear as instructed. Patient may benefit from custom brace in the future -Return to use compression garment when not using ankle brace as tolerated -Patient to return to office as needed or sooner if condition worsens.   Landis Martins, DPM

## 2016-01-18 MED FILL — ATORVASTATIN 80 MG TABLET: 80 | 90 days supply | Qty: 90 | Fill #2

## 2016-01-23 DIAGNOSIS — M25572 Pain in left ankle and joints of left foot: Secondary | ICD-10-CM

## 2016-04-17 MED FILL — ATORVASTATIN 80 MG TABLET: 80 | 90 days supply | Qty: 90 | Fill #3

## 2016-05-20 DIAGNOSIS — R7303 Prediabetes: Secondary | ICD-10-CM | POA: Diagnosis not present

## 2016-05-20 DIAGNOSIS — Z9989 Dependence on other enabling machines and devices: Secondary | ICD-10-CM | POA: Diagnosis not present

## 2016-05-20 DIAGNOSIS — G4733 Obstructive sleep apnea (adult) (pediatric): Secondary | ICD-10-CM | POA: Diagnosis not present

## 2016-05-20 DIAGNOSIS — E785 Hyperlipidemia, unspecified: Secondary | ICD-10-CM | POA: Diagnosis not present

## 2016-05-20 DIAGNOSIS — R030 Elevated blood-pressure reading, without diagnosis of hypertension: Secondary | ICD-10-CM | POA: Diagnosis not present

## 2016-05-20 DIAGNOSIS — Z23 Encounter for immunization: Secondary | ICD-10-CM | POA: Diagnosis not present

## 2016-05-20 DIAGNOSIS — Z0000 Encounter for general adult medical examination without abnormal findings: Secondary | ICD-10-CM | POA: Diagnosis not present

## 2016-05-27 NOTE — Progress Notes (Signed)
Cardiology Office Note   Date:  05/28/2016   ID:  Harry Hall, DOB 1950-09-15, MRN [id number]  [name], MD  Cardiologist:   [name], MD   Chief Complaint  Patient presents with  . Establish Care      History of Present Illness: Harry Hall is a 66 y.o. male who presents for HTN, elevated lipids and morbid obesity  He has been fighting a workman's comp for 2.5 years A ladder broke hanging cable for [employer] And he injured his right shoulder and left ankle. Ankle has pins in it already. Previous smoker quit Years ago Has been obese for years and has an appt to look into bariatric surgery. Sedentary due To weight and ankle pain. No history of vascular disease No chest pain  ECG abnormal with ? Old IMI. Increasing dyspnea for last 6 months. Has gained more weight since injured and not working Admits to over eating a lot.    Past Medical History:  Diagnosis Date  . GERD (gastroesophageal reflux disease)    hx of but not meds  . History of gout   . History of hiatal hernia   . Hyperlipidemia    takes Atorvastatin daily  . Joint pain    right shoulder   . Rotator cuff tear Oct 07, 2013   After ladder broke while installing cable & pt fell 25 feet. Weakness and numbness in right arm  . Shortness of breath dyspnea    with exertion  . Sleep apnea    uses a CPAP    Past Surgical History:  Procedure Laterality Date  . CATARACT EXTRACTION Bilateral 03/17/2010  . COLONOSCOPY    . ESOPHAGOGASTRODUODENOSCOPY    . FRACTURE SURGERY Left 10/09/13   After ladder broke while installing cable & pt fell 25 feet on 10/07/13.  Marland Kitchen HERNIA REPAIR     as a child  . INSERTION OF MESH N/A 06/22/2014   Procedure: INSERTION OF MESH;  Surgeon: Donnie Mesa, MD;  Location: [facility];  Service: General;  Laterality: N/A;  . ORIF CALCANEOUS FRACTURE Left 10/09/2013   Procedure: Open Reduction Internal Fixation Left Calcaneus Fracture;  Surgeon: [name], MD;   Location: [facility];  Service: Orthopedics;  Laterality: Left;  Open Reduction Internal Fixation Left Calcaneus Fracture  . TONSILLECTOMY     as a child  . UMBILICAL HERNIA REPAIR N/A 06/22/2014   Procedure: UMBILICAL HERNIA REPAIR WITH MESH;  Surgeon: Donnie Mesa, MD;  Location: [facility];  Service: General;  Laterality: N/A;     Current Outpatient Prescriptions  Medication Sig Dispense Refill  . aspirin 81 MG tablet Take 81 mg by mouth daily.      Marland Kitchen atorvastatin (LIPITOR) 80 MG tablet Take 80 mg by mouth daily.      . cetirizine (ZYRTEC) 10 MG tablet Take 10 mg by mouth daily.    . fish oil-omega-3 fatty acids 1000 MG capsule Take 1 g by mouth daily.     . meloxicam (MOBIC) 15 MG tablet Take 1 tablet (15 mg total) by mouth daily. 30 tablet 0   Current Facility-Administered Medications  Medication Dose Route Frequency Provider Last Rate Last Dose  . triamcinolone acetonide (KENALOG) 10 MG/ML injection 10 mg  10 mg Other Once Landis Martins, DPM        Allergies:   Patient has no known allergies.    Social History:  The patient  reports that he has quit smoking.  He has never used smokeless tobacco. He reports that he drinks alcohol. He reports that he does not use drugs.   Family History:  The patient's family history includes Cancer in his father.    ROS:  Please see the history of present illness.   Otherwise, review of systems are positive for none.   All other systems are reviewed and negative.    PHYSICAL EXAM: VS:  BP 120/70   Pulse 82   Ht 5\' 8"  (1.727 m)   Wt 296 lb 6.4 oz (134.4 kg)   SpO2 95%   BMI 45.07 kg/m  , BMI Body mass index is 45.07 kg/m. Affect appropriate Obese white male  HEENT: normal Neck supple with no adenopathy JVP normal no bruits no thyromegaly Lungs clear with no wheezing and good diaphragmatic motion Heart:  S1/S2  SEM murmur, no rub, gallop or click PMI normal Abdomen: benighn, BS positve, no tenderness, no AAA no bruit.  No HSM or HJR Distal  pulses intact with no bruits Bilateral plus one edema worse LLE with ankle brace  Neuro non-focal Skin warm and dry No muscular weakness    EKG:  SR rate 78 Q 3, F ? Old IMI    Recent Labs: No results found for requested labs within last 8760 hours.    Lipid Panel No results found for: CHOL, TRIG, HDL, CHOLHDL, VLDL, LDLCALC, LDLDIRECT    Wt Readings from Last 3 Encounters:  05/28/16 296 lb 6.4 oz (134.4 kg)  09/19/15 285 lb (129.3 kg)  06/22/14 274 lb (124.3 kg)      Other studies Reviewed: Additional studies/ records that were reviewed today include: Notes Dr Ponciano Ort ECG labs .    ASSESSMENT AND PLAN:  1.  Morbid obesity discussed diet and encouraged him to f/u bariatric consult  2. HTN BP normal isolated high reading not significant 3. Chol: labs with primary  Continue statin 4. Abnormal ECG : with dyspnea and likely need for bariatric surgery, rotator cuff repair and or left anke surgery f/u Lexiscan myovue 5. Dyspnea SEM likely from obesity but given murmur check echo for RV LV function r/o pulmonary hypertension should be w/u for OSA    Current medicines are reviewed at length with the patient today.  The patient does not have concerns regarding medicines.  The following changes have been made:  no change  Labs/ tests ordered today include: Myovue and Echo   Orders Placed This Encounter  Procedures  . Myocardial Perfusion Imaging  . ECHOCARDIOGRAM COMPLETE     Disposition:   FU with me in 6 months      Signed, [name], MD  05/28/2016 11:37 AM    [facility] [address], [place], [place]  [address] Phone: [phone]; Fax: [phone]

## 2016-05-28 ENCOUNTER — Encounter: Payer: Self-pay | Admitting: Cardiovascular Disease

## 2016-05-28 ENCOUNTER — Ambulatory Visit (INDEPENDENT_AMBULATORY_CARE_PROVIDER_SITE_OTHER): Payer: 59 | Admitting: Cardiovascular Disease

## 2016-05-28 VITALS — BP 120/70 | HR 82 | Ht 68.0 in | Wt 296.4 lb

## 2016-05-28 DIAGNOSIS — R0602 Shortness of breath: Secondary | ICD-10-CM | POA: Diagnosis not present

## 2016-05-28 DIAGNOSIS — Z7689 Persons encountering health services in other specified circumstances: Secondary | ICD-10-CM | POA: Diagnosis not present

## 2016-05-28 DIAGNOSIS — Z01818 Encounter for other preprocedural examination: Secondary | ICD-10-CM | POA: Diagnosis not present

## 2016-05-28 NOTE — Patient Instructions (Addendum)
Medication Instructions:  Your physician recommends that you continue on your current medications as directed. Please refer to the Current Medication list given to you today.  Labwork: NONE  Testing/Procedures: Your physician has requested that you have an echocardiogram. Echocardiography is a painless test that uses sound waves to create images of your heart. It provides your doctor with information about the size and shape of your heart and how well your heart's chambers and valves are working. This procedure takes approximately one hour. There are no restrictions for this procedure.  Your physician has requested that you have a lexiscan myoview. For further information please visit [email or web address]. Please follow instruction sheet, as given.   Follow-Up: Your physician wants you to follow-up as needed with Dr. Nishan.   If you need a refill on your cardiac medications before your next appointment, please call your pharmacy.    

## 2016-06-03 NOTE — Addendum Note (Signed)
Addended by: Roberts Gaudy on: 06/03/2016 07:57 AM   Modules accepted: Orders

## 2016-06-10 ENCOUNTER — Telehealth (HOSPITAL_COMMUNITY): Payer: Self-pay | Admitting: *Deleted

## 2016-06-10 NOTE — Telephone Encounter (Signed)
Left message on voicemail per DPR in reference to upcoming appointment scheduled on 06/12/16 at 1230 with detailed instructions given per Myocardial Perfusion Study Information Sheet for the test. LM to arrive 15 minutes early, and that it is imperative to arrive on time for appointment to keep from having the test rescheduled. If you need to cancel or reschedule your appointment, please call the office within 24 hours of your appointment. Failure to do so may result in a cancellation of your appointment, and a $50 no show fee. Phone number given for call back for any questions.

## 2016-06-12 ENCOUNTER — Ambulatory Visit (HOSPITAL_COMMUNITY): Payer: 59 | Attending: Cardiovascular Disease

## 2016-06-12 DIAGNOSIS — Z7689 Persons encountering health services in other specified circumstances: Secondary | ICD-10-CM | POA: Diagnosis not present

## 2016-06-12 DIAGNOSIS — R9431 Abnormal electrocardiogram [ECG] [EKG]: Secondary | ICD-10-CM | POA: Diagnosis not present

## 2016-06-12 DIAGNOSIS — R0609 Other forms of dyspnea: Secondary | ICD-10-CM | POA: Diagnosis not present

## 2016-06-12 DIAGNOSIS — R9439 Abnormal result of other cardiovascular function study: Secondary | ICD-10-CM | POA: Diagnosis not present

## 2016-06-12 DIAGNOSIS — Z01818 Encounter for other preprocedural examination: Secondary | ICD-10-CM | POA: Diagnosis not present

## 2016-06-12 DIAGNOSIS — R0602 Shortness of breath: Secondary | ICD-10-CM | POA: Diagnosis not present

## 2016-06-12 MED ORDER — REGADENOSON 0.4 MG/5ML IV SOLN
0.4000 mg | Freq: Once | INTRAVENOUS | Status: AC
  Administered 2016-06-12: 0.4 mg via INTRAVENOUS

## 2016-06-12 MED ORDER — TECHNETIUM TC 99M TETROFOSMIN IV KIT
33.0000 | PACK | Freq: Once | INTRAVENOUS | Status: AC | PRN
  Administered 2016-06-12: 33 via INTRAVENOUS
  Filled 2016-06-12: qty 33

## 2016-06-13 ENCOUNTER — Ambulatory Visit (HOSPITAL_BASED_OUTPATIENT_CLINIC_OR_DEPARTMENT_OTHER): Payer: 59

## 2016-06-13 ENCOUNTER — Other Ambulatory Visit: Payer: Self-pay

## 2016-06-13 ENCOUNTER — Ambulatory Visit (HOSPITAL_COMMUNITY): Payer: 59 | Attending: Cardiovascular Disease

## 2016-06-13 DIAGNOSIS — Z01818 Encounter for other preprocedural examination: Secondary | ICD-10-CM | POA: Diagnosis not present

## 2016-06-13 DIAGNOSIS — Z87891 Personal history of nicotine dependence: Secondary | ICD-10-CM | POA: Diagnosis not present

## 2016-06-13 DIAGNOSIS — I10 Essential (primary) hypertension: Secondary | ICD-10-CM | POA: Insufficient documentation

## 2016-06-13 DIAGNOSIS — R0602 Shortness of breath: Secondary | ICD-10-CM | POA: Diagnosis not present

## 2016-06-13 DIAGNOSIS — Z6841 Body Mass Index (BMI) 40.0 and over, adult: Secondary | ICD-10-CM | POA: Diagnosis not present

## 2016-06-13 DIAGNOSIS — Z7689 Persons encountering health services in other specified circumstances: Secondary | ICD-10-CM

## 2016-06-13 DIAGNOSIS — E785 Hyperlipidemia, unspecified: Secondary | ICD-10-CM | POA: Insufficient documentation

## 2016-06-13 DIAGNOSIS — G4733 Obstructive sleep apnea (adult) (pediatric): Secondary | ICD-10-CM | POA: Diagnosis not present

## 2016-06-13 LAB — MYOCARDIAL PERFUSION IMAGING
CHL CUP NUCLEAR SDS: 3
CHL CUP NUCLEAR SRS: 1
LV dias vol: 128 mL (ref 62–150)
LV sys vol: 43 mL
NUC STRESS TID: 0.73
Peak HR: 99 {beats}/min
RATE: 0.41
Rest HR: 73 {beats}/min
SSS: 4

## 2016-06-13 MED ORDER — TECHNETIUM TC 99M TETROFOSMIN IV KIT
31.9000 | PACK | Freq: Once | INTRAVENOUS | Status: AC | PRN
  Administered 2016-06-13: 31.9 via INTRAVENOUS
  Filled 2016-06-13: qty 32

## 2016-06-13 MED ORDER — PERFLUTREN LIPID MICROSPHERE
1.0000 mL | INTRAVENOUS | Status: AC | PRN
  Administered 2016-06-13: 2 mL via INTRAVENOUS

## 2016-06-14 ENCOUNTER — Telehealth: Payer: Self-pay

## 2016-06-14 DIAGNOSIS — Z01812 Encounter for preprocedural laboratory examination: Secondary | ICD-10-CM

## 2016-06-14 NOTE — Telephone Encounter (Signed)
Patient is having heart cath on 06/20/16 with Dr. Tamala Julian. Patient will come to our office for lab work on 06/18/16. Went over instructions with patient, and sent through MyChart for patient to review. Patient will call with any questions or concerns.

## 2016-06-14 NOTE — Telephone Encounter (Signed)
-----   Message from Josue Hector, MD sent at 06/13/2016  5:01 PM EST ----- ECG shows ? Old IMI myovue abnormal with possible need for bariatric surgery would arrange cath to clarify if he has CAD Or not

## 2016-06-18 ENCOUNTER — Other Ambulatory Visit: Payer: 59 | Admitting: *Deleted

## 2016-06-18 ENCOUNTER — Other Ambulatory Visit: Payer: Self-pay | Admitting: Cardiovascular Disease

## 2016-06-18 DIAGNOSIS — I2510 Atherosclerotic heart disease of native coronary artery without angina pectoris: Secondary | ICD-10-CM

## 2016-06-18 DIAGNOSIS — Z7689 Persons encountering health services in other specified circumstances: Secondary | ICD-10-CM | POA: Diagnosis not present

## 2016-06-18 DIAGNOSIS — Z01812 Encounter for preprocedural laboratory examination: Secondary | ICD-10-CM | POA: Diagnosis not present

## 2016-06-18 LAB — CBC WITH DIFFERENTIAL/PLATELET
BASOS ABS: 0 10*3/uL (ref 0.0–0.2)
Basos: 0 %
EOS (ABSOLUTE): 0.2 10*3/uL (ref 0.0–0.4)
Eos: 2 %
Hematocrit: 45.3 % (ref 37.5–51.0)
Hemoglobin: 16 g/dL (ref 13.0–17.7)
IMMATURE GRANS (ABS): 0 10*3/uL (ref 0.0–0.1)
Immature Granulocytes: 1 %
LYMPHS: 26 %
Lymphocytes Absolute: 2.1 10*3/uL (ref 0.7–3.1)
MCH: 32.8 pg (ref 26.6–33.0)
MCHC: 35.3 g/dL (ref 31.5–35.7)
MCV: 93 fL (ref 79–97)
MONOS ABS: 0.6 10*3/uL (ref 0.1–0.9)
Monocytes: 7 %
Neutrophils Absolute: 5.4 10*3/uL (ref 1.4–7.0)
Neutrophils: 64 %
PLATELETS: 266 10*3/uL (ref 150–379)
RBC: 4.88 x10E6/uL (ref 4.14–5.80)
RDW: 13.9 % (ref 12.3–15.4)
WBC: 8.3 10*3/uL (ref 3.4–10.8)

## 2016-06-18 LAB — BASIC METABOLIC PANEL
BUN/Creatinine Ratio: 9 — ABNORMAL LOW (ref 10–24)
BUN: 8 mg/dL (ref 8–27)
CALCIUM: 9.6 mg/dL (ref 8.6–10.2)
CHLORIDE: 99 mmol/L (ref 96–106)
CO2: 24 mmol/L (ref 18–29)
Creatinine, Ser: 0.85 mg/dL (ref 0.76–1.27)
GFR calc Af Amer: 107 mL/min/{1.73_m2} (ref >59)
GFR, EST NON AFRICAN AMERICAN: 93 mL/min/{1.73_m2} (ref >59)
GLUCOSE: 119 mg/dL — AB (ref 65–99)
Potassium: 4.3 mmol/L (ref 3.5–5.2)
SODIUM: 141 mmol/L (ref 134–144)

## 2016-06-18 LAB — PROTIME-INR
INR: 0.9 (ref 0.8–1.2)
Prothrombin Time: 10.1 s (ref 9.1–12.0)

## 2016-06-20 ENCOUNTER — Encounter (HOSPITAL_COMMUNITY)
Admission: RE | Disposition: A | Discharge status: Home / Self Care | Payer: Self-pay | Source: Ambulatory Visit | Attending: Interventional Cardiology

## 2016-06-20 ENCOUNTER — Ambulatory Visit (HOSPITAL_COMMUNITY)
Admission: RE | Admit: 2016-06-20 | Discharge: 2016-06-20 | Disposition: A | Discharge status: Home / Self Care | Payer: 59 | Source: Ambulatory Visit | Attending: Interventional Cardiology | Admitting: Interventional Cardiology

## 2016-06-20 DIAGNOSIS — I10 Essential (primary) hypertension: Secondary | ICD-10-CM | POA: Insufficient documentation

## 2016-06-20 DIAGNOSIS — R0609 Other forms of dyspnea: Secondary | ICD-10-CM

## 2016-06-20 DIAGNOSIS — Z6841 Body Mass Index (BMI) 40.0 and over, adult: Secondary | ICD-10-CM | POA: Diagnosis not present

## 2016-06-20 DIAGNOSIS — R9439 Abnormal result of other cardiovascular function study: Secondary | ICD-10-CM | POA: Diagnosis not present

## 2016-06-20 DIAGNOSIS — Z87891 Personal history of nicotine dependence: Secondary | ICD-10-CM | POA: Diagnosis not present

## 2016-06-20 DIAGNOSIS — R9431 Abnormal electrocardiogram [ECG] [EKG]: Secondary | ICD-10-CM | POA: Diagnosis not present

## 2016-06-20 DIAGNOSIS — G4730 Sleep apnea, unspecified: Secondary | ICD-10-CM | POA: Diagnosis not present

## 2016-06-20 DIAGNOSIS — Z7982 Long term (current) use of aspirin: Secondary | ICD-10-CM | POA: Diagnosis not present

## 2016-06-20 DIAGNOSIS — K219 Gastro-esophageal reflux disease without esophagitis: Secondary | ICD-10-CM | POA: Diagnosis not present

## 2016-06-20 DIAGNOSIS — E785 Hyperlipidemia, unspecified: Secondary | ICD-10-CM | POA: Diagnosis not present

## 2016-06-20 DIAGNOSIS — M109 Gout, unspecified: Secondary | ICD-10-CM | POA: Insufficient documentation

## 2016-06-20 DIAGNOSIS — R011 Cardiac murmur, unspecified: Secondary | ICD-10-CM | POA: Diagnosis not present

## 2016-06-20 DIAGNOSIS — I2510 Atherosclerotic heart disease of native coronary artery without angina pectoris: Secondary | ICD-10-CM

## 2016-06-20 HISTORY — PX: LEFT HEART CATH AND CORONARY ANGIOGRAPHY: CATH118249

## 2016-06-20 SURGERY — LEFT HEART CATH AND CORONARY ANGIOGRAPHY
Anesthesia: LOCAL

## 2016-06-20 MED ORDER — VERAPAMIL HCL 2.5 MG/ML IV SOLN
INTRAVENOUS | Status: AC
  Filled 2016-06-20: qty 2

## 2016-06-20 MED ORDER — LIDOCAINE HCL (PF) 1 % IJ SOLN
INTRAMUSCULAR | Status: DC | PRN
  Administered 2016-06-20: 2 mL

## 2016-06-20 MED ORDER — SODIUM CHLORIDE 0.9 % WEIGHT BASED INFUSION: 1.0000 mL/kg/h | INTRAVENOUS | Status: AC

## 2016-06-20 MED ORDER — LIDOCAINE HCL (PF) 1 % IJ SOLN
INTRAMUSCULAR | Status: AC
  Filled 2016-06-20: qty 30

## 2016-06-20 MED ORDER — SODIUM CHLORIDE 0.9% FLUSH: 3.0000 mL | INTRAVENOUS | Status: DC | PRN

## 2016-06-20 MED ORDER — ACETAMINOPHEN 325 MG PO TABS: 650.0000 mg | ORAL_TABLET | ORAL | Status: DC | PRN

## 2016-06-20 MED ORDER — MIDAZOLAM HCL 2 MG/2ML IJ SOLN
INTRAMUSCULAR | Status: AC
  Filled 2016-06-20: qty 2

## 2016-06-20 MED ORDER — SODIUM CHLORIDE 0.9 % IV SOLN: 250.0000 mL | INTRAVENOUS | Status: DC | PRN

## 2016-06-20 MED ORDER — NITROGLYCERIN 1 MG/10 ML FOR IR/CATH LAB
INTRA_ARTERIAL | Status: AC
Start: 2016-06-20 — End: 2016-06-20
  Filled 2016-06-20: qty 10

## 2016-06-20 MED ORDER — NITROGLYCERIN 1 MG/10 ML FOR IR/CATH LAB
INTRA_ARTERIAL | Status: DC | PRN
  Administered 2016-06-20: 200 ug via INTRACORONARY

## 2016-06-20 MED ORDER — IOPAMIDOL (ISOVUE-370) INJECTION 76%
INTRAVENOUS | Status: DC | PRN
  Administered 2016-06-20: 80 mL via INTRAVENOUS

## 2016-06-20 MED ORDER — ASPIRIN 81 MG PO CHEW: 81.0000 mg | CHEWABLE_TABLET | Freq: Every day | ORAL | Status: DC

## 2016-06-20 MED ORDER — HEPARIN (PORCINE) IN NACL 2-0.9 UNIT/ML-% IJ SOLN
INTRAMUSCULAR | Status: AC
  Filled 2016-06-20: qty 1500

## 2016-06-20 MED ORDER — HEPARIN SODIUM (PORCINE) 1000 UNIT/ML IJ SOLN
INTRAMUSCULAR | Status: DC | PRN
  Administered 2016-06-20: 6000 [IU] via INTRAVENOUS

## 2016-06-20 MED ORDER — HEPARIN SODIUM (PORCINE) 1000 UNIT/ML IJ SOLN
INTRAMUSCULAR | Status: AC
  Filled 2016-06-20: qty 1

## 2016-06-20 MED ORDER — OXYCODONE-ACETAMINOPHEN 5-325 MG PO TABS: 1.0000 | ORAL_TABLET | ORAL | Status: DC | PRN

## 2016-06-20 MED ORDER — MIDAZOLAM HCL 2 MG/2ML IJ SOLN
INTRAMUSCULAR | Status: DC | PRN
  Administered 2016-06-20 (×2): 1 mg via INTRAVENOUS

## 2016-06-20 MED ORDER — FENTANYL CITRATE (PF) 100 MCG/2ML IJ SOLN
INTRAMUSCULAR | Status: DC | PRN
  Administered 2016-06-20: 25 ug via INTRAVENOUS
  Administered 2016-06-20: 50 ug via INTRAVENOUS

## 2016-06-20 MED ORDER — SODIUM CHLORIDE 0.9% FLUSH: 3.0000 mL | Freq: Two times a day (BID) | INTRAVENOUS | Status: DC

## 2016-06-20 MED ORDER — ASPIRIN 81 MG PO CHEW: 81.0000 mg | CHEWABLE_TABLET | ORAL | Status: DC

## 2016-06-20 MED ORDER — VERAPAMIL HCL 2.5 MG/ML IV SOLN
INTRAVENOUS | Status: DC | PRN
  Administered 2016-06-20: 10 mL via INTRA_ARTERIAL

## 2016-06-20 MED ORDER — ONDANSETRON HCL 4 MG/2ML IJ SOLN: 4.0000 mg | Freq: Four times a day (QID) | INTRAMUSCULAR | Status: DC | PRN

## 2016-06-20 MED ORDER — FENTANYL CITRATE (PF) 100 MCG/2ML IJ SOLN
INTRAMUSCULAR | Status: AC
  Filled 2016-06-20: qty 2

## 2016-06-20 MED ORDER — HEPARIN (PORCINE) IN NACL 2-0.9 UNIT/ML-% IJ SOLN
INTRAMUSCULAR | Status: DC | PRN
  Administered 2016-06-20: 500 mL
  Administered 2016-06-20: 1000 mL

## 2016-06-20 MED ORDER — IOPAMIDOL (ISOVUE-370) INJECTION 76%
INTRAVENOUS | Status: AC
  Filled 2016-06-20: qty 100

## 2016-06-20 MED ORDER — SODIUM CHLORIDE 0.9 % WEIGHT BASED INFUSION
3.0000 mL/kg/h | INTRAVENOUS | Status: DC
  Administered 2016-06-20: 3 mL/kg/h via INTRAVENOUS

## 2016-06-20 MED ORDER — SODIUM CHLORIDE 0.9 % WEIGHT BASED INFUSION: 1.0000 mL/kg/h | INTRAVENOUS | Status: DC

## 2016-06-20 SURGICAL SUPPLY — 11 items
CATH EXPO 5F FL3.5 (CATHETERS) ×1 IMPLANT
CATH EXPO 5FR FR4 (CATHETERS) ×2 IMPLANT
DEVICE RAD COMP TR BAND LRG (VASCULAR PRODUCTS) ×1 IMPLANT
GLIDESHEATH SLEND A-KIT 6F 22G (SHEATH) ×1 IMPLANT
GUIDEWIRE INQWIRE 1.5J.035X260 (WIRE) IMPLANT
HOVERMATT SINGLE USE (MISCELLANEOUS) ×1 IMPLANT
INQWIRE 1.5J .035X260CM (WIRE) ×2
KIT HEART LEFT (KITS) ×2 IMPLANT
PACK CARDIAC CATHETERIZATION (CUSTOM PROCEDURE TRAY) ×2 IMPLANT
TRANSDUCER W/STOPCOCK (MISCELLANEOUS) ×2 IMPLANT
TUBING CIL FLEX 10 FLL-RA (TUBING) ×2 IMPLANT

## 2016-06-20 NOTE — H&P (View-Only) (Signed)
Cardiology Office Note   Date:  05/28/2016   ID:  Harry Hall, DOB 1950-06-04, MRN [id number]  [name], MD  Cardiologist:   [name], MD   Chief Complaint  Patient presents with  . Establish Care      History of Present Illness: Harry Hall is a 66 y.o. male who presents for HTN, elevated lipids and morbid obesity  He has been fighting a workman's comp for 2.5 years A ladder broke hanging cable for [employer] And he injured his right shoulder and left ankle. Ankle has pins in it already. Previous smoker quit Years ago Has been obese for years and has an appt to look into bariatric surgery. Sedentary due To weight and ankle pain. No history of vascular disease No chest pain  ECG abnormal with ? Old IMI. Increasing dyspnea for last 6 months. Has gained more weight since injured and not working Admits to over eating a lot.    Past Medical History:  Diagnosis Date  . GERD (gastroesophageal reflux disease)    hx of but not meds  . History of gout   . History of hiatal hernia   . Hyperlipidemia    takes Atorvastatin daily  . Joint pain    right shoulder   . Rotator cuff tear Oct 07, 2013   After ladder broke while installing cable & pt fell 25 feet. Weakness and numbness in right arm  . Shortness of breath dyspnea    with exertion  . Sleep apnea    uses a CPAP    Past Surgical History:  Procedure Laterality Date  . CATARACT EXTRACTION Bilateral 03/17/2010  . COLONOSCOPY    . ESOPHAGOGASTRODUODENOSCOPY    . FRACTURE SURGERY Left 10/09/13   After ladder broke while installing cable & pt fell 25 feet on 10/07/13.  Marland Kitchen HERNIA REPAIR     as a child  . INSERTION OF MESH N/A 06/22/2014   Procedure: INSERTION OF MESH;  Surgeon: Donnie Mesa, MD;  Location: [facility];  Service: General;  Laterality: N/A;  . ORIF CALCANEOUS FRACTURE Left 10/09/2013   Procedure: Open Reduction Internal Fixation Left Calcaneus Fracture;  Surgeon: [name], MD;   Location: [facility];  Service: Orthopedics;  Laterality: Left;  Open Reduction Internal Fixation Left Calcaneus Fracture  . TONSILLECTOMY     as a child  . UMBILICAL HERNIA REPAIR N/A 06/22/2014   Procedure: UMBILICAL HERNIA REPAIR WITH MESH;  Surgeon: Donnie Mesa, MD;  Location: [facility];  Service: General;  Laterality: N/A;     Current Outpatient Prescriptions  Medication Sig Dispense Refill  . aspirin 81 MG tablet Take 81 mg by mouth daily.      Marland Kitchen atorvastatin (LIPITOR) 80 MG tablet Take 80 mg by mouth daily.      . cetirizine (ZYRTEC) 10 MG tablet Take 10 mg by mouth daily.    . fish oil-omega-3 fatty acids 1000 MG capsule Take 1 g by mouth daily.     . meloxicam (MOBIC) 15 MG tablet Take 1 tablet (15 mg total) by mouth daily. 30 tablet 0   Current Facility-Administered Medications  Medication Dose Route Frequency Provider Last Rate Last Dose  . triamcinolone acetonide (KENALOG) 10 MG/ML injection 10 mg  10 mg Other Once Landis Martins, DPM        Allergies:   Patient has no known allergies.    Social History:  The patient  reports that he has quit smoking.  He has never used smokeless tobacco. He reports that he drinks alcohol. He reports that he does not use drugs.   Family History:  The patient's family history includes Cancer in his father.    ROS:  Please see the history of present illness.   Otherwise, review of systems are positive for none.   All other systems are reviewed and negative.    PHYSICAL EXAM: VS:  BP 120/70   Pulse 82   Ht 5\' 8"  (1.727 m)   Wt 296 lb 6.4 oz (134.4 kg)   SpO2 95%   BMI 45.07 kg/m  , BMI Body mass index is 45.07 kg/m. Affect appropriate Obese white male  HEENT: normal Neck supple with no adenopathy JVP normal no bruits no thyromegaly Lungs clear with no wheezing and good diaphragmatic motion Heart:  S1/S2  SEM murmur, no rub, gallop or click PMI normal Abdomen: benighn, BS positve, no tenderness, no AAA no bruit.  No HSM or HJR Distal  pulses intact with no bruits Bilateral plus one edema worse LLE with ankle brace  Neuro non-focal Skin warm and dry No muscular weakness    EKG:  SR rate 78 Q 3, F ? Old IMI    Recent Labs: No results found for requested labs within last 8760 hours.    Lipid Panel No results found for: CHOL, TRIG, HDL, CHOLHDL, VLDL, LDLCALC, LDLDIRECT    Wt Readings from Last 3 Encounters:  05/28/16 296 lb 6.4 oz (134.4 kg)  09/19/15 285 lb (129.3 kg)  06/22/14 274 lb (124.3 kg)      Other studies Reviewed: Additional studies/ records that were reviewed today include: Notes Dr Ponciano Ort ECG labs .    ASSESSMENT AND PLAN:  1.  Morbid obesity discussed diet and encouraged him to f/u bariatric consult  2. HTN BP normal isolated high reading not significant 3. Chol: labs with primary  Continue statin 4. Abnormal ECG : with dyspnea and likely need for bariatric surgery, rotator cuff repair and or left anke surgery f/u Lexiscan myovue 5. Dyspnea SEM likely from obesity but given murmur check echo for RV LV function r/o pulmonary hypertension should be w/u for OSA    Current medicines are reviewed at length with the patient today.  The patient does not have concerns regarding medicines.  The following changes have been made:  no change  Labs/ tests ordered today include: Myovue and Echo   Orders Placed This Encounter  Procedures  . Myocardial Perfusion Imaging  . ECHOCARDIOGRAM COMPLETE     Disposition:   FU with me in 6 months      Signed, [name], MD  05/28/2016 11:37 AM    [facility] [address], [place], [place]  [address] Phone: [phone]; Fax: [phone]

## 2016-06-20 NOTE — Discharge Instructions (Signed)

## 2016-06-20 NOTE — Interval H&P Note (Signed)
Cath Lab Visit (complete for each Cath Lab visit)  Clinical Evaluation Leading to the Procedure:   ACS: No.  Non-ACS:    Anginal Classification: CCS II  Anti-ischemic medical therapy: Minimal Therapy (1 class of medications)  Non-Invasive Test Results: Low-risk stress test findings: cardiac mortality <1%/year  Prior CABG: No previous CABG      History and Physical Interval Note:  06/20/2016 9:02 AM  Harry Hall  has presented today for surgery, with the diagnosis of abnormal myoview  The various methods of treatment have been discussed with the patient and family. After consideration of risks, benefits and other options for treatment, the patient has consented to  Procedure(s): Left Heart Cath and Coronary Angiography (N/A) as a surgical intervention .  The patient's history has been reviewed, patient examined, no change in status, stable for surgery.  I have reviewed the patient's chart and labs.  Questions were answered to the patient's satisfaction.     Harry Hall

## 2016-06-21 ENCOUNTER — Encounter (HOSPITAL_COMMUNITY): Payer: Self-pay | Admitting: Interventional Cardiology

## 2016-06-27 DIAGNOSIS — Z6841 Body Mass Index (BMI) 40.0 and over, adult: Secondary | ICD-10-CM | POA: Diagnosis not present

## 2016-06-27 DIAGNOSIS — R0609 Other forms of dyspnea: Secondary | ICD-10-CM | POA: Diagnosis not present

## 2016-07-23 ENCOUNTER — Ambulatory Visit (INDEPENDENT_AMBULATORY_CARE_PROVIDER_SITE_OTHER): Payer: 59 | Admitting: Sports Medicine

## 2016-07-23 ENCOUNTER — Ambulatory Visit (INDEPENDENT_AMBULATORY_CARE_PROVIDER_SITE_OTHER): Payer: 59

## 2016-07-23 ENCOUNTER — Encounter: Payer: Self-pay | Admitting: Sports Medicine

## 2016-07-23 DIAGNOSIS — M79674 Pain in right toe(s): Secondary | ICD-10-CM

## 2016-07-23 DIAGNOSIS — M7752 Other enthesopathy of left foot: Secondary | ICD-10-CM | POA: Diagnosis not present

## 2016-07-23 DIAGNOSIS — M79672 Pain in left foot: Secondary | ICD-10-CM | POA: Diagnosis not present

## 2016-07-23 DIAGNOSIS — M779 Enthesopathy, unspecified: Secondary | ICD-10-CM

## 2016-07-23 MED ORDER — METHYLPREDNISOLONE 4 MG PO TBPK: ORAL_TABLET | ORAL | 0 refills | Status: DC

## 2016-07-23 MED FILL — METHYLPREDNISOLONE 4 MG TAB: 4 | 6 days supply | Qty: 21 | Fill #0

## 2016-07-23 NOTE — Progress Notes (Addendum)
Patient ID: Harry Hall, male   DOB: 1951-03-07, 66 y.o.   MRN: [id number]   Subjective: Harry Hall is a 66 y.o. male patient who returns to office for follow up evaluation of left foot and ankle pain. Patient states that his left foot and ankle does not bother him and has used brace with relief; states wife is wondering why his left foot "turns out" when he walks. Patient states that there is NEW pain at the top and bottom of the right 2nd toe joint for the last 2 weeks after a fall; reports a little improvement with ice, elevation, and rest; reports "today is first day woke up without pain". Patient states that pain is sharp worse with weightbearing. Patient denies any other pedal complaints.    Patient Active Problem List   Diagnosis Date Noted  . Coronary artery disease involving native coronary artery of native heart without angina pectoris   . Dyspnea on exertion   . Abnormal nuclear stress test   . Ankle pain 02/02/2015  . Pain in shoulder 02/02/2015  . Obstructive apnea 02/02/2015  . Adenomatous colon polyp 07/27/2014  . Morbid (severe) obesity due to excess calories ([place]) 05/16/2014  . Calcaneus fracture, left 10/08/2013  . Exomphalos 09/21/2012  . HLD (hyperlipidemia) 09/19/2011    Current Outpatient Prescriptions on File Prior to Visit  Medication Sig Dispense Refill  . aspirin 81 MG tablet Take 81 mg by mouth daily.      Marland Kitchen atorvastatin (LIPITOR) 80 MG tablet Take 80 mg by mouth daily.      . fish oil-omega-3 fatty acids 1000 MG capsule Take 1 g by mouth daily.      Current Facility-Administered Medications on File Prior to Visit  Medication Dose Route Frequency Provider Last Rate Last Dose  . triamcinolone acetonide (KENALOG) 10 MG/ML injection 10 mg  10 mg Other Once [employer], DPM        No Known Allergies  Objective:  General: Alert and oriented x3 in no acute distress  Dermatology: No open lesions bilateral lower extremities, no webspace  macerations, no ecchymosis bilateral, all nails x 10 are well manicured.  Vascular: Dorsalis Pedis and Posterior Tibial pedal pulses palpable, Capillary Fill Time 3 seconds, scant pedal hair growth bilateral, trace edema bilateral lower extremities, Temperature gradient within normal limits.  Neurology: Gross sensation intact via light touch bilateral. (- )Tinels sign bilateral.   Musculoskeletal: Tenderness to palpation to right 2nd MTPJ. No tenderness with palpation at Left posterior tibial tendon and sinus tarsi. Negative talar tilt, Negative tib-fib stress, No instability. No pain with calf compression bilateral. No acute concern for DVT bilateral. Range of motion within normal limits with mild clicking at left ankle, improved. Abducted position of foot on left. Strength within normal limits in all groups bilateral.    Xray right and left foot- pes planus, posterior and inferior heel spur, hardware intact to left calcaneus, no other acute findings.   Assessment and Plan: Problem List Items Addressed This Visit    None    Visit Diagnoses    Toe pain, right    -  Primary   Relevant Medications   methylPREDNISolone (MEDROL DOSEPAK) 4 MG TBPK tablet   Other Relevant Orders   DG Foot Complete Right (Completed)   Foot pain, left       Relevant Orders   DG Foot Complete Left (Completed)   Capsulitis       Relevant Medications   methylPREDNISolone (MEDROL DOSEPAK) 4  MG TBPK tablet      -Complete examination performed -Discussed treatement options for new capsulitis at right 2nd MTPJ and continued care for left foot arthritis and pes planovalgus -Rx Medrol dose pak and post op shoe for right foot with instructions to wear for 1 week and then slowly wean back to good supportive shoe -Advised continue with ice and gentle range of motion left foot and ankle daily and icing and rest of right foot as needed  -Continue with tri-lock ankle brace to wear as instructed on left. Patient may benefit  from custom brace in the future -Return to use compression garment when not using ankle brace as tolerated -Patient to return to office in 3 weeks or as needed or sooner if condition worsens.   Landis Martins, DPM

## 2016-07-26 ENCOUNTER — Ambulatory Visit (INDEPENDENT_AMBULATORY_CARE_PROVIDER_SITE_OTHER)
Admission: RE | Admit: 2016-07-26 | Discharge: 2016-07-26 | Disposition: A | Discharge status: Home / Self Care | Payer: 59 | Source: Ambulatory Visit | Attending: Internal Medicine | Admitting: Internal Medicine

## 2016-07-26 ENCOUNTER — Encounter: Payer: Self-pay | Admitting: Internal Medicine

## 2016-07-26 ENCOUNTER — Other Ambulatory Visit (INDEPENDENT_AMBULATORY_CARE_PROVIDER_SITE_OTHER): Payer: 59

## 2016-07-26 ENCOUNTER — Ambulatory Visit (INDEPENDENT_AMBULATORY_CARE_PROVIDER_SITE_OTHER): Payer: 59 | Admitting: Internal Medicine

## 2016-07-26 VITALS — BP 146/80 | HR 80 | Ht 68.0 in | Wt 288.0 lb

## 2016-07-26 DIAGNOSIS — R0609 Other forms of dyspnea: Secondary | ICD-10-CM

## 2016-07-26 DIAGNOSIS — R0602 Shortness of breath: Secondary | ICD-10-CM | POA: Diagnosis not present

## 2016-07-26 LAB — TSH: TSH: 1.31 u[IU]/mL (ref 0.35–4.50)

## 2016-07-26 LAB — BRAIN NATRIURETIC PEPTIDE: Pro B Natriuretic peptide (BNP): 36 pg/mL (ref 0.0–100.0)

## 2016-07-26 MED FILL — ATORVASTATIN 80 MG TABLET: 80 | 90 days supply | Qty: 90 | Fill #0

## 2016-07-26 NOTE — Progress Notes (Signed)
Spoke with pt and notified of results per Dr. Wert. Pt verbalized understanding and denied any questions. 

## 2016-07-26 NOTE — Patient Instructions (Addendum)
Please remember to go to the lab and x-ray department downstairs in the basement  for your tests - we will call you with the results when they are available.   Weight control is simply a matter of calorie balance which needs to be tilted in your favor by eating less and exercising more.  To get the most out of exercise, you need to be continuously aware that you are short of breath, but never out of breath, for 30 minutes daily. As you improve, it will actually be easier for you to do the same amount of exercise  in  30 minutes so always push to the level where you are short of breath.       Please schedule a follow up office visit in 4 weeks, sooner if needed with PFTs

## 2016-07-26 NOTE — Progress Notes (Signed)
LMTCB

## 2016-07-26 NOTE — Progress Notes (Signed)
Subjective:     Patient ID: Harry Hall, male   DOB: 1950/12/21,     MRN: [id number]  HPI  46 yowm quit smoking 1998 with no resp problems then at wt 185 but since then  with progressive wt gain   and doe x winter of 2017 and considering bariactric surgery so referred to pulmonary clinic 07/26/2016 by Dr   Dr Tammi Klippel for eval p LHC 2//8/18 showed nl CA with LVEDP 18    07/26/2016 1st [facility] office visit/ Harry Hall  - no resp meds Chief Complaint  Patient presents with  . Pulmonary Consult    Referred by Dr. Molli Barrows. Pt c/o DOE for the past 6 months. He gets winded walking an incline such as to the mailbox and back.   sleeps fine on cpap no 02  50 ft from mb house medium incline has to stop due to sob  Overall doe = MMRC2 = can't walk a nl pace on a flat grade s sob but does fine slow and flat eg shopping / pushing buggy ok   No obvious day to day or daytime variability or assoc excess/ purulent sputum or mucus plugs or hemoptysis or cp or chest tightness, subjective wheeze or overt sinus or hb symptoms. No unusual exp hx or h/o childhood pna/ asthma or knowledge of premature birth.  Sleeping ok without nocturnal  or early am exacerbation  of respiratory  c/o's or need for noct saba. Also denies any obvious fluctuation of symptoms with weather or environmental changes or other aggravating or alleviating factors except as outlined above   Current Medications, Allergies, Complete Past Medical History, Past Surgical History, Family History, and Social History were reviewed in [employer] record.  ROS  The following are not active complaints unless bolded sore throat, dysphagia, dental problems, itching, sneezing,  nasal congestion or excess/ purulent secretions, ear ache,   fever, chills, sweats, unintended wt loss, classically pleuritic or exertional cp,  orthopnea pnd or leg swelling, presyncope, palpitations, abdominal pain, anorexia, nausea, vomiting,  diarrhea  or change in bowel or bladder habits, change in stools or urine, dysuria,hematuria,  rash, arthralgias, visual complaints, headache, numbness, weakness or ataxia or problems with walking or coordination,  change in mood/affect or memory.           Review of Systems     Objective:   Physical Exam amb obese wm nad   Wt Readings from Last 3 Encounters:  07/26/16 288 lb (130.6 kg)  06/20/16 295 lb (133.8 kg)  05/28/16 296 lb 6.4 oz (134.4 kg)    Vital signs reviewed - Note on arrival 02 sats  99% on RA     HEENT: nl dentition, turbinates bilaterally, and oropharynx. Nl external ear canals without cough reflex   NECK :  without JVD/Nodes/TM/ nl carotid upstrokes bilaterally   LUNGS: no acc muscle use,  Nl contour chest which is clear to A and P bilaterally without cough on insp or exp maneuvers   CV:  RRR  no s3 or murmur or increase in P2, and trace edema both lower ext sym  ABD:  Tensely obese byt soft and nontender with limited  inspiratory excursion in the supine position. No bruits or organomegaly appreciated, bowel sounds nl  MS:  Nl gait/ ext warm without deformities, calf tenderness, cyanosis or clubbing No obvious joint restrictions   SKIN: warm and dry without lesions    NEURO:  alert, approp, nl sensorium with  no  motor or cerebellar deficits apparent.     CXR PA and Lateral:   07/26/2016 :    I personally reviewed images and agree with radiology impression as follows:   1. No acute infiltrate or edema. 2. Normal heart size 3. Coarse interstitial opacities at the lung bases, likely chronic.    Labs ordered/ reviewed:      Chemistry      Component Value Date/Time   NA 141 06/18/2016 1015   K 4.3 06/18/2016 1015   CL 99 06/18/2016 1015   CO2 24 06/18/2016 1015   BUN 8 06/18/2016 1015   CREATININE 0.85 06/18/2016 1015      Component Value Date/Time   CALCIUM 9.6 06/18/2016 1015   ALKPHOS 57 10/07/2013 1949   AST 18 10/07/2013 1949   ALT  26 10/07/2013 1949   BILITOT 0.5 10/07/2013 1949        Lab Results  Component Value Date   WBC 8.3 06/18/2016   HGB 16.3 06/14/2014   HCT 45.3 06/18/2016   MCV 93 06/18/2016   PLT 266 06/18/2016      Lab Results  Component Value Date   TSH 1.31 07/26/2016     Lab Results  Component Value Date   PROBNP 36.0 07/26/2016           Assessment:

## 2016-07-27 NOTE — Assessment & Plan Note (Signed)
Echo 2d  06/13/16  - Left ventricle: The cavity size was normal. Wall thickness was   normal. Systolic function was normal. The estimated ejection   fraction was in the range of 55% to 60%. Wall motion was normal;   there were no regional wall motion abnormalities. Acoustic   contrast opacification revealed no evidence ofthrombus. - Right ventricle: The cavity size was mildly dilated. 07/26/2016   Walked RA x one lap @ 185 stopped due to  Feet hurting/ min sob/ sat 90% at end fast  pace    When respiratory symptoms begin or become refractory well after a patient reports complete smoking cessation,  Especially when this wasn't the case while they were smoking, a red flag is raised based on the work of Dr Kris Mouton which states:  if you quit smoking when your best day FEV1 is still well preserved it is highly unlikely you will progress to severe disease.  That is to say, once the smoking stops,  the symptoms should not suddenly erupt or markedly worsen.  If so, the differential diagnosis should include  obesity/deconditioning,  LPR/Reflux/Aspiration syndromes,  occult CHF, or  especially side effect of medications commonly used in this population.    Strongly suspect obesity / deconditioning here though he could well prove to have underlying early PF so needs to f/u with full pfts and meantime try to find an ex he can do for up to 30 min and get himself into neg cal bal  Total time devoted to counseling  > 50 % of initial 60 min office visit:  review case with pt/ discussion of options/alternatives/ personally creating written customized instructions  in presence of pt  then going over those specific  Instructions directly with the pt including how to use all of the meds but in particular covering each new medication in detail and the difference between the maintenance= "automatic" meds and the prns using an action plan format for the latter (If this problem/symptom => do that organization reading Left to  right).  Please see AVS from this visit for a full list of these instructions which I personally wrote for this pt and  are unique to this visit.

## 2016-07-27 NOTE — Assessment & Plan Note (Signed)
Body mass index is 43.79   trending down slightly  Lab Results  Component Value Date   TSH 1.31 07/26/2016     Contributing to osa/ diastolic dysfunction / doe/reviewed the need and the process to achieve and maintain neg calorie balance > defer f/u primary care including intermittently monitoring thyroid status

## 2016-08-13 ENCOUNTER — Ambulatory Visit: Payer: 59 | Admitting: Sports Medicine

## 2016-09-17 ENCOUNTER — Encounter: Payer: Self-pay | Admitting: Internal Medicine

## 2016-09-17 ENCOUNTER — Ambulatory Visit (INDEPENDENT_AMBULATORY_CARE_PROVIDER_SITE_OTHER): Payer: 59 | Admitting: Internal Medicine

## 2016-09-17 VITALS — BP 120/62 | HR 73 | Ht 68.0 in | Wt 286.0 lb

## 2016-09-17 DIAGNOSIS — R0609 Other forms of dyspnea: Secondary | ICD-10-CM | POA: Diagnosis not present

## 2016-09-17 LAB — PULMONARY FUNCTION TEST
DL/VA % pred: 104 %
DL/VA: 4.7 ml/min/mmHg/L
DLCO cor % pred: 74 %
DLCO cor: 21.95 ml/min/mmHg
DLCO unc % pred: 73 %
DLCO unc: 21.7 ml/min/mmHg
FEF 25-75 Post: 1.7 L/sec
FEF 25-75 Pre: 1.36 L/sec
FEF2575-%Change-Post: 25 %
FEF2575-%Pred-Post: 65 %
FEF2575-%Pred-Pre: 52 %
FEV1-%Change-Post: 4 %
FEV1-%Pred-Post: 73 %
FEV1-%Pred-Pre: 70 %
FEV1-POST: 2.36 L
FEV1-Pre: 2.25 L
FEV1FVC-%Change-Post: 4 %
FEV1FVC-%Pred-Pre: 94 %
FEV6-%Change-Post: 0 %
FEV6-%Pred-Post: 77 %
FEV6-%Pred-Pre: 77 %
FEV6-POST: 3.18 L
FEV6-Pre: 3.17 L
FEV6FVC-%Change-Post: 0 %
FEV6FVC-%PRED-POST: 105 %
FEV6FVC-%PRED-PRE: 105 %
FVC-%CHANGE-POST: 0 %
FVC-%PRED-POST: 73 %
FVC-%PRED-PRE: 73 %
FVC-PRE: 3.17 L
FVC-Post: 3.18 L
POST FEV6/FVC RATIO: 100 %
PRE FEV1/FVC RATIO: 71 %
PRE FEV6/FVC RATIO: 100 %
Post FEV1/FVC ratio: 74 %
RV % PRED: 61 %
RV: 1.37 L
TLC % PRED: 69 %
TLC: 4.57 L

## 2016-09-17 NOTE — Progress Notes (Signed)
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 Pulmonary Consult    Referred by Dr. Molli Barrows. Pt c/o DOE for the past 6 months. He gets winded walking an incline such as to the mailbox and back.   sleeps fine on cpap no 02  50 ft from mb house medium incline has to stop due to sob x one year  Overall doe = MMRC2 = can't walk a nl pace on a flat grade s sob but does fine slow and flat eg shopping / pushing buggy ok rec Work on wt      09/17/2016  f/u ov/Harry Hall re:  Doe / min restrictive pfts/ no resp rx  Chief Complaint  Patient presents with  . Follow-up    PFT's done today. Breathing is unchanged and he denies any new co's.   no change in doe x mb to house  Sleeps on cpap on left side horizontally s apparent problems  No obvious day to day or daytime variability or assoc excess/ purulent sputum or mucus plugs or hemoptysis or cp or chest tightness, subjective wheeze or overt sinus or hb symptoms. No unusual exp hx or h/o childhood pna/ asthma or knowledge of premature birth.  Sleeping ok without nocturnal  or early am exacerbation  of respiratory  c/o's or need for noct saba. Also denies any obvious fluctuation of symptoms with weather or environmental changes or other aggravating or alleviating factors except as outlined above   Current Medications, Allergies, Complete Past Medical History, Past Surgical History, Family History, and Social History were reviewed in [employer]  record.  ROS  The following are not active complaints unless bolded sore throat, dysphagia, dental problems, itching, sneezing,  nasal congestion or excess/ purulent secretions, ear ache,   fever, chills, sweats, unintended wt loss, classically pleuritic or exertional cp,  orthopnea pnd or leg swelling, presyncope, palpitations, abdominal pain, anorexia, nausea, vomiting, diarrhea  or change in bowel or bladder habits, change in stools or urine, dysuria,hematuria,  rash, arthralgias, visual complaints, headache, numbness, weakness or ataxia or problems with walking or coordination,  change in mood/affect or memory.              Objective:   Physical Exam   amb obese wm nad   09/17/2016          286   07/26/16 288 lb (130.6 kg)  06/20/16 295 lb (133.8 kg)  05/28/16 296 lb 6.4 oz (134.4 kg)    Vital signs reviewed - Note on arrival 02 sats  95% on RA     HEENT: nl dentition, turbinates bilaterally, and oropharynx. Nl external ear canals without cough reflex   NECK :  without JVD/Nodes/TM/ nl carotid upstrokes bilaterally   LUNGS: no acc muscle use,  Nl contour chest which is clear to A and P bilaterally without cough on insp or exp maneuvers  CV:  RRR  no s3 or murmur or increase in P2, and  Trace ankle pitting sym edema  ABD:  Tensely obese but soft and nontender with limited  inspiratory excursion in the supine position. No bruits or organomegaly appreciated, bowel sounds nl  MS:  Nl gait/ ext warm without deformities, calf tenderness, cyanosis or clubbing No obvious joint restrictions   SKIN: warm and dry without lesions    NEURO:  alert, approp, nl sensorium with  no motor or cerebellar deficits apparent.     CXR PA and Lateral:   07/26/2016 :    I personally reviewed images and agree with radiology impression as follows:   1. No acute infiltrate or edema. 2. Normal heart size 3. Coarse interstitial opacities at the lung bases, likely chronic.   Labs ordered/  reviewed:      Chemistry      Component Value Date/Time   NA 141 06/18/2016 1015   K 4.3 06/18/2016 1015   CL 99 06/18/2016 1015   CO2 24 06/18/2016 1015   BUN 8 06/18/2016 1015   CREATININE 0.85 06/18/2016 1015      Component Value Date/Time   CALCIUM 9.6 06/18/2016 1015   ALKPHOS 57 10/07/2013 1949   AST 18 10/07/2013 1949   ALT 26 10/07/2013 1949   BILITOT 0.5 10/07/2013 1949        Lab Results  Component Value Date   WBC 8.3 06/18/2016   HGB 16.3 06/14/2014   HCT 45.3 06/18/2016   MCV 93 06/18/2016   PLT 266 06/18/2016         Lab Results  Component Value Date   TSH 1.31 07/26/2016     Lab Results  Component Value Date   PROBNP 36.0 07/26/2016       Labs ordered 09/17/2016   D dimer/ ESR       Assessment:

## 2016-09-17 NOTE — Patient Instructions (Addendum)
Please remember to go to the lab department downstairs in the basement  for your tests - we will call you with the results when they are available.  Keep working on the weight loss - if these labs are ok you can be approved for bariatric surgery if you chose to pursue it  Pulmonary follow up is as needed

## 2016-09-17 NOTE — Progress Notes (Signed)
PFT done today. 

## 2016-09-18 ENCOUNTER — Telehealth: Payer: Self-pay | Admitting: *Deleted

## 2016-09-18 NOTE — Assessment & Plan Note (Signed)
Echo 2d  06/13/16  - Left ventricle: The cavity size was normal. Wall thickness was   normal. Systolic function was normal. The estimated ejection   fraction was in the range of 55% to 60%. Wall motion was normal;   there were no regional wall motion abnormalities. Acoustic   contrast opacification revealed no evidence ofthrombus. - Right ventricle: The cavity size was mildly dilated. 07/26/2016   Walked RA x one lap @ 185 stopped due to  Feet hurting/ min sob/ sat 90% at end fast  Pace 09/17/2016  Walked RA x 3 laps @ 185 ft each stopped due to  End of study, fast pace, no sob or desat  Slowed only by foot pain  - PFT's  09/17/2016  FEV1 2.36 (73 % ) ratio 74  p 4 % improvement from saba p nothing prior to study with DLCO  73/74  % corrects to 104  % for alv volume and ERV 82       His main problem is his weight which puts him at risk for postoperative respiratory failure on the basis of poor abdominal compliance and also thromboembolic events. I recommended a baseline d-dimer preop and explained to him there was a risk that he would have to except that can be mitigated by medications related to his morbid obesity. That having been said, I see no contraindication to proceeding with bariatric surgery and he is cleared pending a baseline D dimer  I had an extended discussion with the patient reviewing all relevant studies completed to date and  lasting 15 to 20 minutes of a 25 minute visit    Each maintenance medication was reviewed in detail including most importantly the difference between maintenance and prns and under what circumstances the prns are to be triggered using an action plan format that is not reflected in the computer generated alphabetically organized AVS.    Please see AVS for specific instructions unique to this visit that I personally wrote and verbalized to the the pt in detail and then reviewed with pt  by my nurse highlighting any  changes in therapy recommended at today's visit to  their plan of care.

## 2016-09-18 NOTE — Telephone Encounter (Signed)
-----   Message from Tanda Rockers, MD sent at 09/18/2016 10:47 AM EDT ----- Does not look like he went for his labs as requested will return clear for surgery

## 2016-09-18 NOTE — Assessment & Plan Note (Signed)
Body mass index is 43.49 kg/m.  -  trending down slightly / encouraged Lab Results  Component Value Date   TSH 1.31 07/26/2016     Contributing to gerd risk/ doe/reviewed the need and the process to achieve and maintain neg calorie balance > defer f/u primary care including intermittently monitoring thyroid status

## 2016-09-18 NOTE — Telephone Encounter (Signed)
Spoke with the pt  He states he was going to be late for another appt and this is why he did not go to the lab  He plans on coming back one day next wk to get this done  Will forward to MW to make him aware

## 2016-10-18 MED FILL — ATORVASTATIN 80 MG TABLET: 80 | 90 days supply | Qty: 90 | Fill #1

## 2017-02-05 MED FILL — ATORVASTATIN 80 MG TABLET: 80 | 90 days supply | Qty: 90 | Fill #2

## 2017-05-18 IMAGING — NM NM MISC PROCEDURE
3 series · 18 of 18 positions shown · non-contrast
Comparison: none

[Series 1: wbr_r-proj_st rest_(id)_sa · 6.5mm · 6.51mm/px · 6 of 64 frames shown]
[frame 6/64]
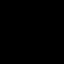
[frame 16/64]
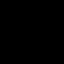
[frame 27/64]
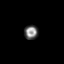
[frame 38/64]
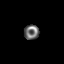
[frame 48/64]
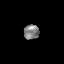
[frame 59/64]
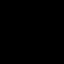

[Series 1: wbr_s-proj_st stress_(id)_sa · 6.5mm · 6.51mm/px · 6 of 512 frames shown (1 of 2)]
[frame 43/512]
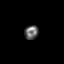
[frame 128/512]
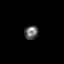
[frame 214/512]
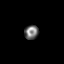
[frame 299/512]
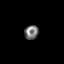
[frame 384/512]
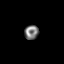
[frame 470/512]
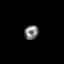

[Series 1: wbr_s-proj_st stress_(id)_sa · 6.5mm · 6.51mm/px · 6 of 64 frames shown (2 of 2)]
[frame 6/64]
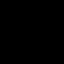
[frame 16/64]
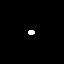
[frame 27/64]
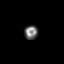
[frame 38/64]
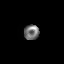
[frame 48/64]
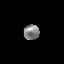
[frame 59/64]
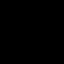

[18 of 18 positions shown; findings below may reference images not displayed]

Canned report from images found in remote index.

Refer to host system for actual result text.

## 2017-05-20 MED FILL — ATORVASTATIN 80 MG TABLET: 80 | 30 days supply | Qty: 30 | Fill #0

## 2017-05-28 ENCOUNTER — Ambulatory Visit: Payer: Self-pay | Admitting: Medical

## 2017-05-29 ENCOUNTER — Encounter: Payer: Self-pay | Admitting: Medical

## 2017-05-29 ENCOUNTER — Ambulatory Visit: Payer: 59 | Admitting: Medical

## 2017-05-29 VITALS — BP 140/80 | HR 68 | Temp 98.1°F | Resp 16 | Ht 68.0 in | Wt 279.0 lb

## 2017-05-29 DIAGNOSIS — E785 Hyperlipidemia, unspecified: Secondary | ICD-10-CM | POA: Diagnosis not present

## 2017-05-29 DIAGNOSIS — Z8709 Personal history of other diseases of the respiratory system: Secondary | ICD-10-CM

## 2017-05-29 DIAGNOSIS — J309 Allergic rhinitis, unspecified: Secondary | ICD-10-CM

## 2017-05-29 DIAGNOSIS — R12 Heartburn: Secondary | ICD-10-CM

## 2017-05-29 DIAGNOSIS — M791 Myalgia, unspecified site: Secondary | ICD-10-CM

## 2017-05-29 DIAGNOSIS — G4730 Sleep apnea, unspecified: Secondary | ICD-10-CM

## 2017-05-29 DIAGNOSIS — Z87898 Personal history of other specified conditions: Secondary | ICD-10-CM

## 2017-05-29 DIAGNOSIS — R030 Elevated blood-pressure reading, without diagnosis of hypertension: Secondary | ICD-10-CM | POA: Diagnosis not present

## 2017-05-29 LAB — COMPREHENSIVE METABOLIC PANEL
ALBUMIN: 4.3 g/dL (ref 3.5–5.2)
ALT: 20 U/L (ref 0–53)
AST: 15 U/L (ref 0–37)
Alkaline Phosphatase: 59 U/L (ref 39–117)
BUN: 9 mg/dL (ref 6–23)
CALCIUM: 9.3 mg/dL (ref 8.4–10.5)
CHLORIDE: 102 meq/L (ref 96–112)
CO2: 29 mEq/L (ref 19–32)
Creatinine, Ser: 0.7 mg/dL (ref 0.40–1.50)
GFR: 120.39 mL/min (ref >60.00)
Glucose, Bld: 120 mg/dL — ABNORMAL HIGH (ref 70–99)
POTASSIUM: 3.9 meq/L (ref 3.5–5.1)
Sodium: 139 mEq/L (ref 135–145)
Total Bilirubin: 0.7 mg/dL (ref 0.2–1.2)
Total Protein: 6.8 g/dL (ref 6.0–8.3)

## 2017-05-29 LAB — LIPID PANEL
CHOLESTEROL: 155 mg/dL (ref 0–200)
HDL: 41.4 mg/dL (ref >39.00)
LDL CALC: 83 mg/dL (ref 0–99)
NonHDL: 113.84
TRIGLYCERIDES: 153 mg/dL — AB (ref 0.0–149.0)
Total CHOL/HDL Ratio: 4
VLDL: 30.6 mg/dL (ref 0.0–40.0)

## 2017-05-29 LAB — SEDIMENTATION RATE: Sed Rate: 3 mm/hr (ref 0–20)

## 2017-05-29 NOTE — Progress Notes (Signed)
Subjective:    Patient ID: Harry Hall, male    DOB: May 13, 1951, 67 y.o.   MRN: [id number]  HPI  Pt in for first time. Pt states needs to change since current pcp won't accept medicare. He will turn 67 this year. Pt is retired. Education officer, community. Pt does not work out regularly. Pt states eats healthy. Pt has 30 lbs on purpose. States gained weight  after ankle injury.  Pt does drink alcohol. 3 beers at night on average. Non smoker(hx of smoker 20 years or more) Stopped 20 years ago.  Pt has hx of hearburn occasion. Occurs about twice a month after eating particular foods. Does not take any meds for this.  Hx of sleep apnea. Had dx 5 years ago. He has a machine. He feels well using cpap. Sees Dr. Melvyn Novas.  Has high cholesterol. He is on lipitor. Years ago he stated took generic version and did not work. Pt states for years no side effects. Faint soreness of thighs recently. Mild and not related to activitiy. Pain varies in intensity. Pt had cardiac catheterization and was negative in past.  Pt does have history of allergies. Mild seasonal. He uses zyrtec daily.  Pt is obesity. He was considering weight loss specialist. He lost 60 lb over 8 months. He gained some weight back over the holidays.   Pt had dyspnea on exertion. Pt had negative cardiac cath and negativ bnp. Pt states just before thanksgiving he noticed less sob with exertion with weight loss but then gained 5 lb over holiday and dyspnea on exertion returned. Prior ekg January 2018.  On review of chart it looks like pulmonologist ordered d-dimer in the past but patient never got that done.  Patient does not have any leg pain, leg swelling or any popliteal pain.    Review of Systems  Constitutional: Negative for chills, fatigue and fever.  HENT: Negative for congestion, drooling, ear discharge, mouth sores, postnasal drip, rhinorrhea, sinus pain, sneezing and sore throat.   Respiratory: Negative for cough, chest tightness,  shortness of breath and wheezing.        Hx of sob on exertion. But not present today.  Cardiovascular: Negative for chest pain and palpitations.  Gastrointestinal: Negative for abdominal distention, abdominal pain, constipation, nausea and vomiting.  Genitourinary: Negative for dysuria, flank pain and urgency.  Musculoskeletal: Negative for back pain, gait problem and neck pain.  Skin: Negative for rash.  Neurological: Negative for dizziness, speech difficulty, weakness, light-headedness and headaches.  Hematological: Negative for adenopathy. Does not bruise/bleed easily.  Psychiatric/Behavioral: Negative for behavioral problems, confusion and suicidal ideas. The patient is not nervous/anxious and is not hyperactive.     Past Medical History:  Diagnosis Date  . GERD (gastroesophageal reflux disease)    hx of but not meds  . History of gout   . History of hiatal hernia   . Hyperlipidemia    takes Atorvastatin daily  . Joint pain    right shoulder   . Rotator cuff tear Oct 07, 2013   After ladder broke while installing cable & pt fell 25 feet. Weakness and numbness in right arm  . Shortness of breath dyspnea    with exertion  . Sleep apnea    uses a CPAP     Social History   Socioeconomic History  . Marital status: Married    Spouse name: Not on file  . Number of children: Not on file  . Years of education: Not on  file  . Highest education level: Not on file  Social Needs  . Financial resource strain: Not on file  . Food insecurity - worry: Not on file  . Food insecurity - inability: Not on file  . Transportation needs - medical: Not on file  . Transportation needs - non-medical: Not on file  Occupational History  . Not on file  Tobacco Use  . Smoking status: Former Smoker    Packs/day: 2.00    Years: 20.00    Pack years: 40.00    Types: Cigarettes    Last attempt to quit: 05/13/1996    Years since quitting: 21.0  . Smokeless tobacco: Never Used  Substance and  Sexual Activity  . Alcohol use: Yes    Comment: weekly  . Drug use: No  . Sexual activity: Yes  Other Topics Concern  . Not on file  Social History Narrative  . Not on file    Past Surgical History:  Procedure Laterality Date  . CATARACT EXTRACTION Bilateral 03/17/2010  . COLONOSCOPY    . ESOPHAGOGASTRODUODENOSCOPY    . FRACTURE SURGERY Left 10/09/13   After ladder broke while installing cable & pt fell 25 feet on 10/07/13.  Marland Kitchen HERNIA REPAIR     as a child  . INSERTION OF MESH N/A 06/22/2014   Procedure: INSERTION OF MESH;  Surgeon: Donnie Mesa, MD;  Location: [facility];  Service: General;  Laterality: N/A;  . LEFT HEART CATH AND CORONARY ANGIOGRAPHY N/A 06/20/2016   Procedure: Left Heart Cath and Coronary Angiography;  Surgeon: Belva Crome, MD;  Location: [facility];  Service: Cardiovascular;  Laterality: N/A;  . ORIF CALCANEOUS FRACTURE Left 10/09/2013   Procedure: Open Reduction Internal Fixation Left Calcaneus Fracture;  Surgeon: [name], MD;  Location: [facility];  Service: Orthopedics;  Laterality: Left;  Open Reduction Internal Fixation Left Calcaneus Fracture  . TONSILLECTOMY     as a child  . UMBILICAL HERNIA REPAIR N/A 06/22/2014   Procedure: UMBILICAL HERNIA REPAIR WITH MESH;  Surgeon: Donnie Mesa, MD;  Location: [facility];  Service: General;  Laterality: N/A;    Family History  Problem Relation Age of Onset  . Cancer Father        pancreatic    No Known Allergies  Current Outpatient Medications on File Prior to Visit  Medication Sig Dispense Refill  . atorvastatin (LIPITOR) 80 MG tablet TAKE 1 TABLET (80 MG TOTAL) BY MOUTH DAILY.    Marland Kitchen aspirin 81 MG tablet Take 81 mg by mouth daily.      . cetirizine (ZYRTEC) 10 MG tablet Take 10 mg by mouth daily.     Current Facility-Administered Medications on File Prior to Visit  Medication Dose Route Frequency Provider Last Rate Last Dose  . triamcinolone acetonide (KENALOG) 10 MG/ML injection 10 mg  10 mg Other Once  Stover, Titorya, DPM        BP (!) 145/61 (BP Location: Left Arm, Patient Position: Sitting, Cuff Size: Large)   Pulse 68   Temp 98.1 F (36.7 C) (Oral)   Resp 16   Ht 5\' 8"  (1.727 m)   Wt 279 lb (126.6 kg)   SpO2 95%   BMI 42.42 kg/m       Objective:   Physical Exam  General Mental Status- Alert. General Appearance- Not in acute distress.   Skin General: Color- Normal Color. Moisture- Normal Moisture.  Neck Carotid Arteries- Normal color. Moisture- Normal Moisture. No carotid bruits. No JVD.  Chest and Lung Exam Auscultation: Breath Sounds:-Normal.  Cardiovascular Auscultation:Rythm- Regular. Murmurs & Other Heart Sounds:Auscultation of the heart reveals- No Murmurs.  Abdomen Inspection:-Inspeection Normal. Palpation/Percussion:Note:No mass. Palpation and Percussion of the abdomen reveal- Non Tender, Non Distended + BS, no rebound or guarding.    Neurologic Cranial Nerve exam:- CN III-XII intact(No nystagmus), symmetric smile. Strength:- 5/5 equal and symmetric strength both upper and lower extremities.   Lower extremity-calves symmetric, no pedal edema, and negative Homans sign bilaterally     Assessment & Plan:  For your history of obesity, I want you to continue to lose weight.  You have lost a lot so far but if you hit a plateau and not losing further weight or regaining then please let me know and I could put in referral to weight loss specialist.  For your high cholesterol history, I am putting in lab work today to get CMP and lipid panel.  Since you know that brand Lipitor will be extremely expensive when you have Medicare, I want you to go ahead and ask the pharmacist how much generic Crestor would cost you.  Ask what is price for generic Crestor No. 30 tablets.  For history of rare occasional heartburn, try to eat healthy diet but can occasionally use Zantac over-the-counter if needed.  For allergic rhinitis continue your Zyrtec.  For sleep apnea  continue with CPAP and follow-up with pulmonologist.  For your very myalgia of thighs( mild to moderate and intermittent), I do want to get sed rate today.  I saw him chart is is a future order so you should be able to get that done today.  Not convinced presently at the is a result of Lipitor.  If the pain worsens then would recommend stopping Lipitor and seeing if thigh pain resolved.  Follow-up date to be determined after lab review.  Omesha Bowerman, Percell Miller, PA-C

## 2017-05-29 NOTE — Patient Instructions (Addendum)
For your history of obesity, I want you to continue to lose weight.  You have lost a lot so far but if you hit a plateau and not losing further weight or regaining then please let me know and I could put in referral to weight loss specialist.  For your high cholesterol history, I am putting in lab work today to get CMP and lipid panel.  Since you know that brand Lipitor will be extremely expensive when you have Medicare, I want you to go ahead and ask the pharmacist how much generic Crestor would cost you.  Ask what is price for generic Crestor No. 30 tablets.  For history of rare occasional heartburn, try to eat healthy diet but can occasionally use Zantac over-the-counter if needed.  For allergic rhinitis continue your Zyrtec.  For sleep apnea continue with CPAP and follow-up with pulmonologist.  For your very myalgia of thighs( mild to moderate and intermittent), I do want to get sed rate today.  I saw him chart is is a future order so you should be able to get that done today.  Not convinced presently at the is a result of Lipitor.  If the pain worsens then would recommend stopping Lipitor and seeing if thigh pain resolved.  Your blood pressure is borderline today.  I want you to get over-the-counter blood pressure cuff and start to check your blood pressure at home.  If you see trend of blood pressure 140/90 or higher in you would need blood pressure medication.  Follow-up date to be determined after lab review.

## 2017-05-30 LAB — D-DIMER, QUANTITATIVE: D-Dimer, Quant: 0.29 mcg/mL FEU (ref <0.50)

## 2017-06-19 DIAGNOSIS — Z9989 Dependence on other enabling machines and devices: Secondary | ICD-10-CM | POA: Diagnosis not present

## 2017-06-19 DIAGNOSIS — J9811 Atelectasis: Secondary | ICD-10-CM | POA: Diagnosis not present

## 2017-06-19 DIAGNOSIS — G4733 Obstructive sleep apnea (adult) (pediatric): Secondary | ICD-10-CM | POA: Diagnosis not present

## 2017-06-19 DIAGNOSIS — R9389 Abnormal findings on diagnostic imaging of other specified body structures: Secondary | ICD-10-CM | POA: Diagnosis not present

## 2017-06-19 DIAGNOSIS — R05 Cough: Secondary | ICD-10-CM | POA: Diagnosis not present

## 2017-06-19 DIAGNOSIS — J101 Influenza due to other identified influenza virus with other respiratory manifestations: Secondary | ICD-10-CM | POA: Diagnosis not present

## 2017-06-19 DIAGNOSIS — I2510 Atherosclerotic heart disease of native coronary artery without angina pectoris: Secondary | ICD-10-CM | POA: Diagnosis not present

## 2017-06-19 DIAGNOSIS — Z6841 Body Mass Index (BMI) 40.0 and over, adult: Secondary | ICD-10-CM | POA: Diagnosis not present

## 2017-06-19 DIAGNOSIS — R509 Fever, unspecified: Secondary | ICD-10-CM | POA: Diagnosis not present

## 2017-06-19 MED FILL — OSELTAMIVIR PHOSPHATE 75 MG: 75 | 5 days supply | Qty: 10 | Fill #0

## 2017-06-19 MED FILL — GUAIATUSSIN AC LIQUID: 100-10 | 3 days supply | Qty: 120 | Fill #0

## 2017-06-19 MED FILL — AMOXICILLIN-POT CLAVULANATE: 875-125 | 10 days supply | Qty: 20 | Fill #0

## 2017-06-30 ENCOUNTER — Ambulatory Visit: Payer: 59 | Admitting: Medical

## 2017-06-30 ENCOUNTER — Encounter: Payer: Self-pay | Admitting: Medical

## 2017-06-30 VITALS — BP 123/65 | HR 72 | Temp 98.2°F | Resp 16 | Ht 68.0 in | Wt 279.2 lb

## 2017-06-30 DIAGNOSIS — R739 Hyperglycemia, unspecified: Secondary | ICD-10-CM

## 2017-06-30 DIAGNOSIS — E785 Hyperlipidemia, unspecified: Secondary | ICD-10-CM | POA: Diagnosis not present

## 2017-06-30 DIAGNOSIS — R030 Elevated blood-pressure reading, without diagnosis of hypertension: Secondary | ICD-10-CM

## 2017-06-30 LAB — HEMOGLOBIN A1C: Hgb A1c MFr Bld: 6.4 % (ref 4.6–6.5)

## 2017-06-30 MED ORDER — ATORVASTATIN CALCIUM 80 MG PO TABS: 80.0000 mg | ORAL_TABLET | Freq: Every day | ORAL | 3 refills | Status: AC

## 2017-06-30 MED FILL — ATORVASTATIN 80 MG TABLET: 80 | 90 days supply | Qty: 90 | Fill #0

## 2017-06-30 NOTE — Progress Notes (Signed)
Subjective:    Patient ID: Harry Hall, male    DOB: 07/26/1950, 67 y.o.   MRN: [id number]  HPI  Pt bp was borderline lat visit. Was 140/80. Today bp was 123/65.   Pt blood sugar was mild elevated on screening. Will get a1-c today.   Pt thigh muscle soreness got better. Some still faint. Sed rate was negative. He is still on statin.  Pt triglycerides were mild elevated. Other lipid markers looked good. Pt is still on atorvastatin.      Review of Systems  Constitutional: Negative for chills, fatigue and fever.  HENT: Negative for congestion, ear discharge, ear pain and hearing loss.   Respiratory: Negative for chest tightness, shortness of breath and stridor.   Cardiovascular: Negative for palpitations.  Gastrointestinal: Negative for abdominal pain, blood in stool and constipation.  Genitourinary: Negative for difficulty urinating, discharge, enuresis, flank pain, frequency, genital sores, testicular pain and urgency.  Musculoskeletal: Negative for back pain, myalgias, neck pain and neck stiffness.  Skin: Negative for pallor and rash.  Neurological: Negative for dizziness, seizures, syncope, weakness and light-headedness.  Hematological: Negative for adenopathy. Does not bruise/bleed easily.  Psychiatric/Behavioral: Negative for behavioral problems, confusion, sleep disturbance and suicidal ideas. The patient is not nervous/anxious.     Past Medical History:  Diagnosis Date  . GERD (gastroesophageal reflux disease)    hx of but not meds  . History of gout   . History of hiatal hernia   . Hyperlipidemia    takes Atorvastatin daily  . Joint pain    right shoulder   . Rotator cuff tear Oct 07, 2013   After ladder broke while installing cable & pt fell 25 feet. Weakness and numbness in right arm  . Shortness of breath dyspnea    with exertion  . Sleep apnea    uses a CPAP     Social History   Socioeconomic History  . Marital status: Married    Spouse name:  Not on file  . Number of children: Not on file  . Years of education: Not on file  . Highest education level: Not on file  Social Needs  . Financial resource strain: Not on file  . Food insecurity - worry: Not on file  . Food insecurity - inability: Not on file  . Transportation needs - medical: Not on file  . Transportation needs - non-medical: Not on file  Occupational History  . Not on file  Tobacco Use  . Smoking status: Former Smoker    Packs/day: 2.00    Years: 20.00    Pack years: 40.00    Types: Cigarettes    Last attempt to quit: 05/13/1996    Years since quitting: 21.1  . Smokeless tobacco: Never Used  Substance and Sexual Activity  . Alcohol use: Yes    Comment: weekly  . Drug use: No  . Sexual activity: Yes  Other Topics Concern  . Not on file  Social History Narrative  . Not on file    Past Surgical History:  Procedure Laterality Date  . CATARACT EXTRACTION Bilateral 03/17/2010  . COLONOSCOPY    . ESOPHAGOGASTRODUODENOSCOPY    . FRACTURE SURGERY Left 10/09/13   After ladder broke while installing cable & pt fell 25 feet on 10/07/13.  Marland Kitchen HERNIA REPAIR     as a child  . INSERTION OF MESH N/A 06/22/2014   Procedure: INSERTION OF MESH;  Surgeon: Donnie Mesa, MD;  Location: [facility];  Service: General;  Laterality: N/A;  . LEFT HEART CATH AND CORONARY ANGIOGRAPHY N/A 06/20/2016   Procedure: Left Heart Cath and Coronary Angiography;  Surgeon: Belva Crome, MD;  Location: [facility];  Service: Cardiovascular;  Laterality: N/A;  . ORIF CALCANEOUS FRACTURE Left 10/09/2013   Procedure: Open Reduction Internal Fixation Left Calcaneus Fracture;  Surgeon: [name], MD;  Location: [facility];  Service: Orthopedics;  Laterality: Left;  Open Reduction Internal Fixation Left Calcaneus Fracture  . TONSILLECTOMY     as a child  . UMBILICAL HERNIA REPAIR N/A 06/22/2014   Procedure: UMBILICAL HERNIA REPAIR WITH MESH;  Surgeon: Donnie Mesa, MD;  Location: [facility];  Service:  General;  Laterality: N/A;    Family History  Problem Relation Age of Onset  . Cancer Father        pancreatic    No Known Allergies  Current Outpatient Medications on File Prior to Visit  Medication Sig Dispense Refill  . aspirin 81 MG tablet Take 81 mg by mouth daily.      Marland Kitchen atorvastatin (LIPITOR) 80 MG tablet TAKE 1 TABLET (80 MG TOTAL) BY MOUTH DAILY.    . cetirizine (ZYRTEC) 10 MG tablet Take 10 mg by mouth daily.     Current Facility-Administered Medications on File Prior to Visit  Medication Dose Route Frequency Provider Last Rate Last Dose  . triamcinolone acetonide (KENALOG) 10 MG/ML injection 10 mg  10 mg Other Once Stover, Titorya, DPM        BP 123/65   Pulse 72   Temp 98.2 F (36.8 C) (Oral)   Resp 16   Ht 5\' 8"  (1.727 m)   Wt 279 lb 3.2 oz (126.6 kg)   SpO2 96%   BMI 42.45 kg/m       Objective:   Physical Exam  General Mental Status- Alert. General Appearance- Not in acute distress.   Skin General: Color- Normal Color. Moisture- Normal Moisture.  Neck Carotid Arteries- Normal color. Moisture- Normal Moisture. No carotid bruits. No JVD.  Chest and Lung Exam Auscultation: Breath Sounds:-Normal.  Cardiovascular Auscultation:Rythm- Regular. Murmurs & Other Heart Sounds:Auscultation of the heart reveals- No Murmurs.  Abdomen Inspection:-Inspeection Normal. Palpation/Percussion:Note:No mass. Palpation and Percussion of the abdomen reveal- Non Tender, Non Distended + BS, no rebound or guarding.   Neurologic Cranial Nerve exam:- CN III-XII intact(No nystagmus), symmetric smile. Strength:- 5/5 equal and symmetric strength both upper and lower extremities.      Assessment & Plan:  For mild high blood sugar in the past and on recent labs will get a1-c today.  For high cholesterol continue lipitor. I would recommend that you ask around what generic atorvastatin. Or might need to rx pravastatin or simvastatin as you go turn 67 yo. Generic  atorvastatin may be much cheaper than brand name lipitor.  Keep checking bp occasionally.   Follow up in 3 months or as needed  [employer], [employer]

## 2017-06-30 NOTE — Patient Instructions (Addendum)
For mild high blood sugar in the past and on recent labs will get a1-c today.  For high cholesterol continue lipitor. I would recommend that you ask around what generic atorvastatin. Or might need to rx pravastatin or simvastatin as you go turn 67 yo. Generic atorvastatin may be much cheaper than brand name lipitor.  Keep checking bp occasionally.   Follow up in 3 months or as needed

## 2017-07-01 ENCOUNTER — Telehealth: Payer: Self-pay | Admitting: Medical

## 2017-07-01 ENCOUNTER — Other Ambulatory Visit: Payer: Self-pay

## 2017-07-01 DIAGNOSIS — R739 Hyperglycemia, unspecified: Secondary | ICD-10-CM

## 2017-07-01 MED ORDER — METFORMIN HCL 500 MG PO TABS: 500.0000 mg | ORAL_TABLET | Freq: Two times a day (BID) | ORAL | 3 refills | Status: AC

## 2017-07-01 MED FILL — metFORMIN HCL 500 MG TABS: 500 | 90 days supply | Qty: 180 | Fill #0

## 2017-07-01 NOTE — Progress Notes (Unsigned)
met 

## 2017-07-01 NOTE — Telephone Encounter (Signed)
Prescription of metformin sent to patient's pharmacy.

## 2017-09-29 ENCOUNTER — Encounter: Payer: Self-pay | Admitting: Medical

## 2017-09-29 ENCOUNTER — Ambulatory Visit (INDEPENDENT_AMBULATORY_CARE_PROVIDER_SITE_OTHER): Payer: Medicare Other | Admitting: Medical

## 2017-09-29 VITALS — BP 135/60 | HR 68 | Temp 98.3°F | Resp 16 | Ht 68.0 in | Wt 282.2 lb

## 2017-09-29 DIAGNOSIS — R21 Rash and other nonspecific skin eruption: Secondary | ICD-10-CM | POA: Diagnosis not present

## 2017-09-29 DIAGNOSIS — E785 Hyperlipidemia, unspecified: Secondary | ICD-10-CM

## 2017-09-29 DIAGNOSIS — R739 Hyperglycemia, unspecified: Secondary | ICD-10-CM

## 2017-09-29 LAB — COMPREHENSIVE METABOLIC PANEL
ALBUMIN: 4.2 g/dL (ref 3.5–5.2)
ALT: 26 U/L (ref 0–53)
AST: 14 U/L (ref 0–37)
Alkaline Phosphatase: 50 U/L (ref 39–117)
BUN: 10 mg/dL (ref 6–23)
CALCIUM: 9.5 mg/dL (ref 8.4–10.5)
CHLORIDE: 102 meq/L (ref 96–112)
CO2: 31 mEq/L (ref 19–32)
Creatinine, Ser: 0.77 mg/dL (ref 0.40–1.50)
GFR: 107.74 mL/min (ref >60.00)
Glucose, Bld: 128 mg/dL — ABNORMAL HIGH (ref 70–99)
POTASSIUM: 4.2 meq/L (ref 3.5–5.1)
Sodium: 142 mEq/L (ref 135–145)
Total Bilirubin: 0.5 mg/dL (ref 0.2–1.2)
Total Protein: 6.5 g/dL (ref 6.0–8.3)

## 2017-09-29 LAB — LIPID PANEL
CHOLESTEROL: 128 mg/dL (ref 0–200)
HDL: 35.8 mg/dL — AB (ref >39.00)
LDL CALC: 72 mg/dL (ref 0–99)
NonHDL: 92.68
TRIGLYCERIDES: 102 mg/dL (ref 0.0–149.0)
Total CHOL/HDL Ratio: 4
VLDL: 20.4 mg/dL (ref 0.0–40.0)

## 2017-09-29 LAB — HEMOGLOBIN A1C: Hgb A1c MFr Bld: 6.1 % (ref 4.6–6.5)

## 2017-09-29 MED ORDER — CLOTRIMAZOLE-BETAMETHASONE 1-0.05 % EX CREA: 1.0000 "application " | TOPICAL_CREAM | Freq: Two times a day (BID) | CUTANEOUS | 0 refills | Status: AC

## 2017-09-29 MED FILL — CLOTRIMAZOLE-BETAMETHASONE: 1-0.05 | 15 days supply | Qty: 30 | Fill #0

## 2017-09-29 NOTE — Progress Notes (Signed)
Subjective:    Patient ID: Harry Hall, male    DOB: 22-Jul-1950, 67 y.o.   MRN: [id number]  HPI  Pt in for follow up.  Hx of high cholesterol. He is fasting past midnight.  Pt states he is trying to get more exercise. He will be working at [employer]. Expects to get more walking. 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  Pt sugars have been borderline diabetic for years. He is on metformin.    Review of Systems  Constitutional: Negative for chills, fatigue and fever.  Respiratory: Negative for cough, chest tightness, shortness of breath and wheezing.   Cardiovascular: Negative for chest pain and palpitations.  Gastrointestinal: Negative for abdominal pain, constipation, nausea and vomiting.  Musculoskeletal: Negative for back pain, neck pain and neck stiffness.  Skin: Negative for rash.       Fating red area on his left arm. 4 cm x 4 cm. Present x 2 wks. No warmth, no induration. Faint itching presently.   No fever, no chills, no myalgias.   Neurological: Negative for facial asymmetry and headaches.  Psychiatric/Behavioral: Negative for behavioral problems and confusion.    Past Medical History:  Diagnosis Date  . GERD (gastroesophageal reflux disease)    hx of but not meds  . History of gout   . History of hiatal hernia   . Hyperlipidemia    takes Atorvastatin daily  . Joint pain    right shoulder   . Rotator cuff tear Oct 07, 2013   After ladder broke while installing cable & pt fell 25 feet. Weakness and numbness in right arm  . Shortness of breath dyspnea    with exertion  . Sleep apnea    uses a CPAP     Social History   Socioeconomic History  . Marital status: Married    Spouse name: Not on file  . Number of children: Not on file  . Years of education: Not on file  . Highest education level: Not on file  Occupational History  . Not on file  Social Needs  . Financial resource strain: Not on file  . Food insecurity:    Worry: Not on file    Inability: Not  on file  . Transportation needs:    Medical: Not on file    Non-medical: Not on file  Tobacco Use  . Smoking status: Former Smoker    Packs/day: 2.00    Years: 20.00    Pack years: 40.00    Types: Cigarettes    Last attempt to quit: 05/13/1996    Years since quitting: 21.3  . Smokeless tobacco: Never Used  Substance and Sexual Activity  . Alcohol use: Yes    Comment: weekly  . Drug use: No  . Sexual activity: Yes  Lifestyle  . Physical activity:    Days per week: Not on file    Minutes per session: Not on file  . Stress: Not on file  Relationships  . Social connections:    Talks on phone: Not on file    Gets together: Not on file    Attends religious service: Not on file    Active member of club or organization: Not on file    Attends meetings of clubs or organizations: Not on file    Relationship status: Not on file  . Intimate partner violence:    Fear of current or ex partner: Not on file    Emotionally abused: Not on file    Physically abused:  Not on file    Forced sexual activity: Not on file  Other Topics Concern  . Not on file  Social History Narrative  . Not on file    Past Surgical History:  Procedure Laterality Date  . CATARACT EXTRACTION Bilateral 03/17/2010  . COLONOSCOPY    . ESOPHAGOGASTRODUODENOSCOPY    . FRACTURE SURGERY Left 10/09/13   After ladder broke while installing cable & pt fell 25 feet on 10/07/13.  Marland Kitchen HERNIA REPAIR     as a child  . INSERTION OF MESH N/A 06/22/2014   Procedure: INSERTION OF MESH;  Surgeon: Donnie Mesa, MD;  Location: [facility];  Service: General;  Laterality: N/A;  . LEFT HEART CATH AND CORONARY ANGIOGRAPHY N/A 06/20/2016   Procedure: Left Heart Cath and Coronary Angiography;  Surgeon: Belva Crome, MD;  Location: [facility];  Service: Cardiovascular;  Laterality: N/A;  . ORIF CALCANEOUS FRACTURE Left 10/09/2013   Procedure: Open Reduction Internal Fixation Left Calcaneus Fracture;  Surgeon: [name], MD;   Location: [facility];  Service: Orthopedics;  Laterality: Left;  Open Reduction Internal Fixation Left Calcaneus Fracture  . TONSILLECTOMY     as a child  . UMBILICAL HERNIA REPAIR N/A 06/22/2014   Procedure: UMBILICAL HERNIA REPAIR WITH MESH;  Surgeon: Donnie Mesa, MD;  Location: [facility];  Service: General;  Laterality: N/A;    Family History  Problem Relation Age of Onset  . Cancer Father        pancreatic    No Known Allergies  Current Outpatient Medications on File Prior to Visit  Medication Sig Dispense Refill  . aspirin 81 MG tablet Take 81 mg by mouth daily.      Marland Kitchen atorvastatin (LIPITOR) 80 MG tablet TAKE 1 TABLET (80 MG TOTAL) BY MOUTH DAILY.    Marland Kitchen atorvastatin (LIPITOR) 80 MG tablet Take 1 tablet (80 mg total) by mouth daily. 90 tablet 3  . cetirizine (ZYRTEC) 10 MG tablet Take 10 mg by mouth daily.    . metFORMIN (GLUCOPHAGE) 500 MG tablet Take 1 tablet (500 mg total) by mouth 2 (two) times daily with a meal. 180 tablet 3   Current Facility-Administered Medications on File Prior to Visit  Medication Dose Route Frequency Provider Last Rate Last Dose  . triamcinolone acetonide (KENALOG) 10 MG/ML injection 10 mg  10 mg Other Once Stover, Titorya, DPM        BP 135/60   Pulse 68   Temp 98.3 F (36.8 C) (Oral)   Resp 16   Ht 5\' 8"  (1.727 m)   Wt 282 lb 3.2 oz (128 kg)   SpO2 96%   BMI 42.91 kg/m       Objective:   Physical Exam  General Mental Status- Alert. General Appearance- Not in acute distress.   Skin General: Color- Normal Color. Moisture- Normal Moisture.  Neck Carotid Arteries- Normal color. Moisture- Normal Moisture. No carotid bruits. No JVD.  Chest and Lung Exam Auscultation: Breath Sounds:-Normal.  Cardiovascular Auscultation:Rythm- Regular. Murmurs & Other Heart Sounds:Auscultation of the heart reveals- No Murmurs.  Abdomen Inspection:-Inspeection Normal. Palpation/Percussion:Note:No mass. Palpation and Percussion of the abdomen reveal- Non  Tender, Non Distended + BS, no rebound or guarding.    Neurologic Cranial Nerve exam:- CN III-XII intact(No nystagmus), symmetric smile. Strength:- 5/5 equal and symmetric strength both upper and lower extremities.  Left upper arm- 4 cm x 4 cm red area to left upper arm. No redness, no warmth and no tender. Feels faint  dry.      Assessment & Plan:  For elevated sugars in past/prediabetic range a1c continue metformin and get a1c. Will follow and make med adjustments if necessary.  For high cholesterol will get cmp and lipid panel.  Follow up date to be determined after lab review.  For rash to left upper arm. Will rx lotrisone cream. Use twice daily. Give me update in 10 days please. Also moisturize area 1-2 times daily.   Mackie Pai, PA-C

## 2017-09-29 NOTE — Patient Instructions (Addendum)
For elevated sugars in past/prediabetic range a1c continue metformin and get a1c. Will follow and make med adjustments if necessary.  For high cholesterol will get cmp and lipid panel.  Follow up date to be determined after lab review.  For rash to left upper arm. Will rx lotrisone cream. Use twice daily. Give me update in 10 days please. Also moisturize area 1-2 times daily.

## 2017-10-22 MED FILL — metFORMIN HCL 500 MG TABS: 500 | 90 days supply | Qty: 180 | Fill #1

## 2017-10-28 MED FILL — DICLOFENAC SOD EC 75 MG TAB: 75 | 14 days supply | Qty: 28 | Fill #0

## 2017-10-28 MED FILL — CYCLOBENZAPRINE 5 MG TABLET: 5 | 4 days supply | Qty: 7 | Fill #0

## 2018-03-05 MED FILL — metFORMIN HCL 500 MG TABS: 500 | 90 days supply | Qty: 180 | Fill #2

## 2018-04-10 IMAGING — DX DG CHEST 2V
2 series · 2 of 2 positions shown · non-contrast
Comparison: 10/07/2013

CLINICAL DATA: Shortness of breath on exertion

EXAM:
CHEST  2 VIEW

[chest pa]
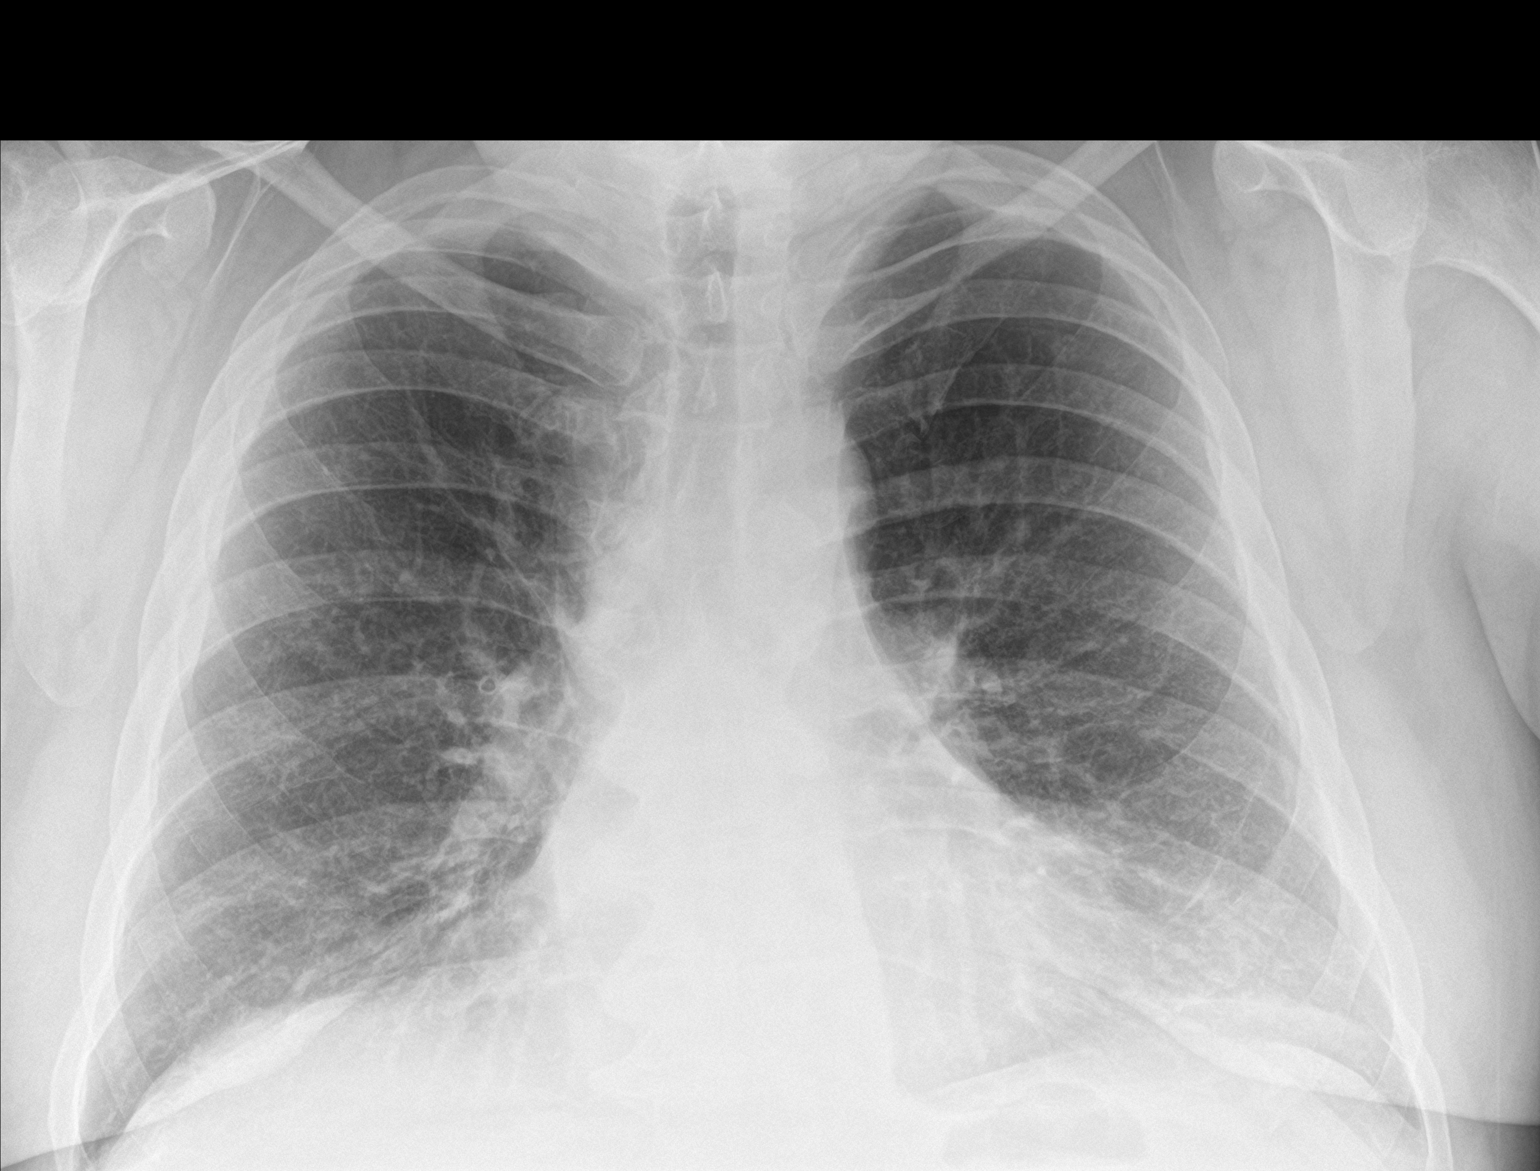

[chest lat]
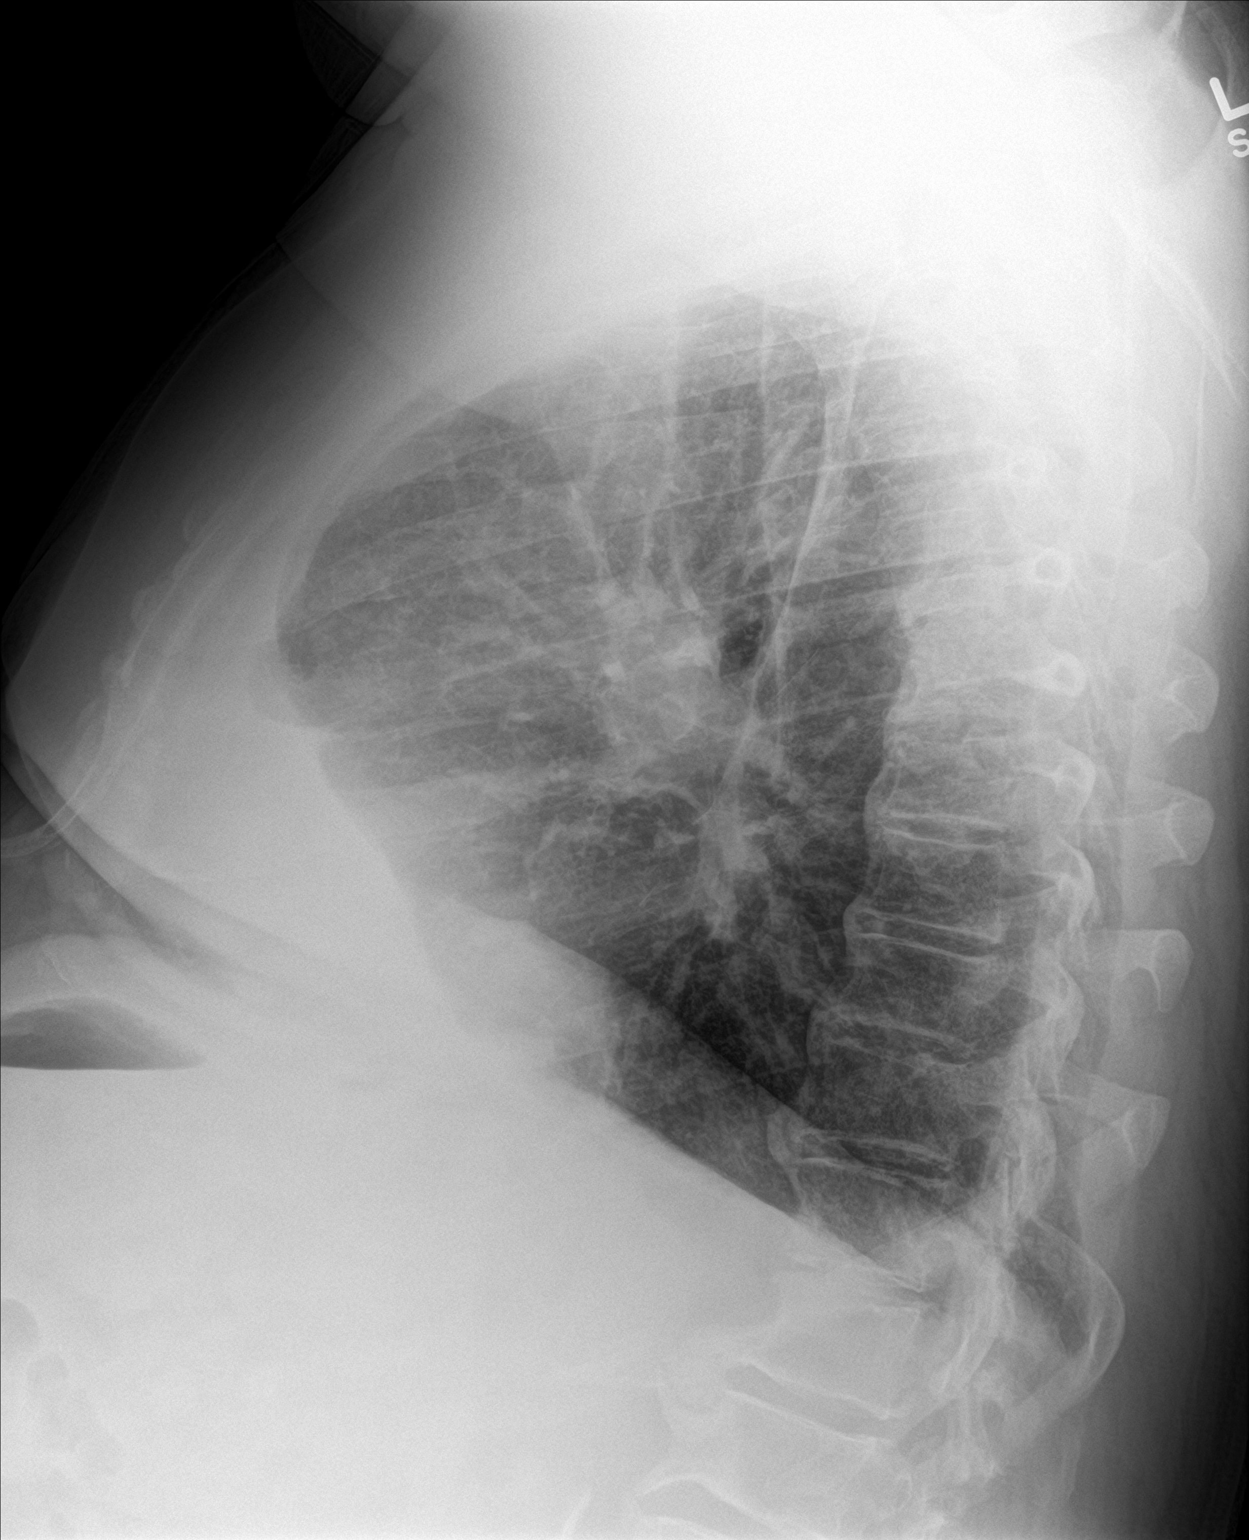

[2 of 2 positions shown; findings below may reference images not displayed]

FINDINGS: Mild hyperinflation. Coarse interstitial opacities at the bilateral
lung bases, likely chronic. No consolidation or pleural effusion.
Cardiomediastinal silhouette within normal limits. No pneumothorax.
Degenerative osteophytes of the spine.
IMPRESSION: 1. No acute infiltrate or edema.
2. Normal heart size
3. Coarse interstitial opacities at the lung bases, likely chronic.

## 2018-06-09 MED FILL — ATORVASTATIN 80 MG TABLET: 80 | 90 days supply | Qty: 90 | Fill #0

## 2018-07-17 MED FILL — metFORMIN HCL 500 MG TABS: 500 | 90 days supply | Qty: 180 | Fill #0

## 2018-07-29 MED FILL — ATORVASTATIN 80 MG TABLET: 80 | 90 days supply | Qty: 90 | Fill #0

## 2018-08-22 MED FILL — CYCLOBENZAPRINE HCL 10 MG T: 10 | 14 days supply | Qty: 42 | Fill #0

## 2018-08-22 MED FILL — traMADol HCL 50 MG TABS: 50 | 5 days supply | Qty: 15 | Fill #0

## 2018-08-22 MED FILL — NAPROXEN 500 MG TABLET: 500 | 14 days supply | Qty: 28 | Fill #0

## 2018-11-02 MED FILL — METOCLOPRAMIDE 10 MG TABLET: 10 | 5 days supply | Qty: 20 | Fill #0

## 2018-11-02 MED FILL — OMEPRAZOLE 40 MG CPDR: 40 | 30 days supply | Qty: 30 | Fill #0

## 2018-11-02 MED FILL — ONDANSETRON ODT 8 MG TABLET: 8 | 10 days supply | Qty: 30 | Fill #0

## 2018-11-02 MED FILL — URSODIOL 250 MG TABLET: 250 | 30 days supply | Qty: 60 | Fill #0

## 2018-11-02 MED FILL — levoFLOXacin 500 MG TABS: 500 | 1 days supply | Qty: 1 | Fill #0

## 2018-12-09 MED FILL — OMEPRAZOLE 40 MG CPDR: 40 | 30 days supply | Qty: 30 | Fill #1

## 2018-12-10 MED FILL — ATORVASTATIN 40 MG TABLET: 40 | 90 days supply | Qty: 90 | Fill #0

## 2018-12-10 MED FILL — metFORMIN HCL 500 MG TABS: 500 | 90 days supply | Qty: 180 | Fill #0

## 2019-01-04 MED FILL — OMEPRAZOLE 40 MG CPDR: 40 | 30 days supply | Qty: 30 | Fill #2

## 2019-01-11 MED FILL — URSODIOL 250 MG TABLET: 250 | 30 days supply | Qty: 60 | Fill #1

## 2019-02-08 MED FILL — OMEPRAZOLE 40 MG CPDR: 40 | 30 days supply | Qty: 30 | Fill #3

## 2019-02-15 MED FILL — CLINDAMYCIN HCL 300 MG CAPS: 300 | 7 days supply | Qty: 21 | Fill #0

## 2019-02-18 MED FILL — URSODIOL 250 MG TABLET: 250 | 30 days supply | Qty: 60 | Fill #2

## 2019-08-04 MED FILL — AMOXICILLIN 875 MG TABS: 875 | 5 days supply | Qty: 10 | Fill #0

## 2019-08-04 MED FILL — HYDROCODON-APAP 7.5-325: 7.5-325 | 2 days supply | Qty: 6 | Fill #0
